# Patient Record
Sex: Female | Born: 1965 | Race: White | Hispanic: No | Marital: Married | Smoking: Never smoker
Health system: Southern US, Community
[De-identification: ages and names within clinical notes are randomized; demographics above are authoritative.]

## PROBLEM LIST (undated history)

## (undated) DIAGNOSIS — K219 Gastro-esophageal reflux disease without esophagitis: Secondary | ICD-10-CM

## (undated) DIAGNOSIS — Z9889 Other specified postprocedural states: Secondary | ICD-10-CM

## (undated) DIAGNOSIS — R112 Nausea with vomiting, unspecified: Secondary | ICD-10-CM

## (undated) DIAGNOSIS — Z87442 Personal history of urinary calculi: Secondary | ICD-10-CM

## (undated) DIAGNOSIS — T8859XA Other complications of anesthesia, initial encounter: Secondary | ICD-10-CM

## (undated) DIAGNOSIS — J189 Pneumonia, unspecified organism: Secondary | ICD-10-CM

## (undated) DIAGNOSIS — N200 Calculus of kidney: Secondary | ICD-10-CM

## (undated) DIAGNOSIS — A419 Sepsis, unspecified organism: Secondary | ICD-10-CM

## (undated) HISTORY — PX: DILATION AND CURETTAGE OF UTERUS: SHX78

## (undated) HISTORY — DX: Gastro-esophageal reflux disease without esophagitis: K21.9

---

## 1995-05-04 HISTORY — PX: URETEROSCOPY: SHX842

## 2006-05-03 HISTORY — PX: LITHOTRIPSY: SUR834

## 2010-07-20 ENCOUNTER — Ambulatory Visit
Admission: RE | Admit: 2010-07-20 | Discharge: 2010-07-20 | Disposition: A | Discharge status: Home / Self Care | Payer: BC Managed Care – PPO | Source: Ambulatory Visit | Attending: Family Medicine | Admitting: Family Medicine

## 2010-07-20 ENCOUNTER — Other Ambulatory Visit: Payer: Self-pay | Admitting: Family Medicine

## 2010-07-20 DIAGNOSIS — J3489 Other specified disorders of nose and nasal sinuses: Secondary | ICD-10-CM

## 2015-12-08 ENCOUNTER — Observation Stay (HOSPITAL_COMMUNITY)
Admission: EM | Admit: 2015-12-08 | Discharge: 2015-12-10 | Disposition: A | Discharge status: Home / Self Care | Payer: BLUE CROSS/BLUE SHIELD | Attending: Urology | Admitting: Urology

## 2015-12-08 DIAGNOSIS — R109 Unspecified abdominal pain: Secondary | ICD-10-CM

## 2015-12-08 DIAGNOSIS — N132 Hydronephrosis with renal and ureteral calculous obstruction: Principal | ICD-10-CM | POA: Insufficient documentation

## 2015-12-08 DIAGNOSIS — N200 Calculus of kidney: Secondary | ICD-10-CM

## 2015-12-08 DIAGNOSIS — N201 Calculus of ureter: Secondary | ICD-10-CM | POA: Diagnosis present

## 2015-12-08 HISTORY — DX: Calculus of kidney: N20.0

## 2015-12-09 ENCOUNTER — Encounter (HOSPITAL_COMMUNITY): Payer: Self-pay | Admitting: Emergency Medicine

## 2015-12-09 ENCOUNTER — Observation Stay (HOSPITAL_COMMUNITY): Payer: BLUE CROSS/BLUE SHIELD | Admitting: Anesthesiology

## 2015-12-09 ENCOUNTER — Emergency Department (HOSPITAL_COMMUNITY): Payer: BLUE CROSS/BLUE SHIELD

## 2015-12-09 ENCOUNTER — Encounter (HOSPITAL_COMMUNITY)
Admission: EM | Disposition: A | Discharge status: Home / Self Care | Payer: Self-pay | Source: Home / Self Care | Attending: Emergency Medicine

## 2015-12-09 ENCOUNTER — Ambulatory Visit: Admit: 2015-12-09 | Payer: Self-pay | Admitting: Urology

## 2015-12-09 DIAGNOSIS — N201 Calculus of ureter: Secondary | ICD-10-CM | POA: Diagnosis present

## 2015-12-09 DIAGNOSIS — N200 Calculus of kidney: Secondary | ICD-10-CM

## 2015-12-09 DIAGNOSIS — N132 Hydronephrosis with renal and ureteral calculous obstruction: Secondary | ICD-10-CM | POA: Diagnosis not present

## 2015-12-09 HISTORY — PX: CYSTOSCOPY WITH RETROGRADE PYELOGRAM, URETEROSCOPY AND STENT PLACEMENT: SHX5789

## 2015-12-09 LAB — CBC WITH DIFFERENTIAL/PLATELET
Basophils Absolute: 0 10*3/uL (ref 0.0–0.1)
Basophils Relative: 0 %
EOS ABS: 0.2 10*3/uL (ref 0.0–0.7)
EOS PCT: 2 %
HCT: 33.8 % — ABNORMAL LOW (ref 36.0–46.0)
Hemoglobin: 11.5 g/dL — ABNORMAL LOW (ref 12.0–15.0)
LYMPHS ABS: 1.7 10*3/uL (ref 0.7–4.0)
LYMPHS PCT: 16 %
MCH: 30.3 pg (ref 26.0–34.0)
MCHC: 34 g/dL (ref 30.0–36.0)
MCV: 89.2 fL (ref 78.0–100.0)
MONOS PCT: 11 %
Monocytes Absolute: 1.1 10*3/uL — ABNORMAL HIGH (ref 0.1–1.0)
Neutro Abs: 7.2 10*3/uL (ref 1.7–7.7)
Neutrophils Relative %: 71 %
PLATELETS: 230 10*3/uL (ref 150–400)
RBC: 3.79 MIL/uL — AB (ref 3.87–5.11)
RDW: 12.5 % (ref 11.5–15.5)
WBC: 10.1 10*3/uL (ref 4.0–10.5)

## 2015-12-09 LAB — I-STAT CHEM 8, ED
BUN: 16 mg/dL (ref 6–20)
CALCIUM ION: 1.13 mmol/L (ref 1.13–1.30)
CHLORIDE: 102 mmol/L (ref 101–111)
Creatinine, Ser: 1.2 mg/dL — ABNORMAL HIGH (ref 0.44–1.00)
GLUCOSE: 97 mg/dL (ref 65–99)
HCT: 35 % — ABNORMAL LOW (ref 36.0–46.0)
Hemoglobin: 11.9 g/dL — ABNORMAL LOW (ref 12.0–15.0)
Potassium: 3.8 mmol/L (ref 3.5–5.1)
Sodium: 140 mmol/L (ref 135–145)
TCO2: 26 mmol/L (ref 0–100)

## 2015-12-09 LAB — SURGICAL PCR SCREEN
MRSA, PCR: NEGATIVE
STAPHYLOCOCCUS AUREUS: NEGATIVE

## 2015-12-09 LAB — URINALYSIS, ROUTINE W REFLEX MICROSCOPIC
BILIRUBIN URINE: NEGATIVE
Glucose, UA: NEGATIVE mg/dL
HGB URINE DIPSTICK: NEGATIVE
KETONES UR: NEGATIVE mg/dL
Leukocytes, UA: NEGATIVE
Nitrite: NEGATIVE
PH: 6.5 (ref 5.0–8.0)
Protein, ur: NEGATIVE mg/dL
SPECIFIC GRAVITY, URINE: 1.018 (ref 1.005–1.030)

## 2015-12-09 SURGERY — CYSTOURETEROSCOPY, WITH RETROGRADE PYELOGRAM AND STENT INSERTION
Anesthesia: General | Site: Ureter | Laterality: Left

## 2015-12-09 MED ORDER — FENTANYL CITRATE (PF) 100 MCG/2ML IJ SOLN
50.0000 ug | Freq: Once | INTRAMUSCULAR | Status: AC
  Administered 2015-12-09: 50 ug via INTRAVENOUS
  Filled 2015-12-09: qty 2

## 2015-12-09 MED ORDER — CEFAZOLIN IN D5W 1 GM/50ML IV SOLN
1.0000 g | Freq: Three times a day (TID) | INTRAVENOUS | Status: DC
  Administered 2015-12-10 (×2): 1 g via INTRAVENOUS
  Filled 2015-12-09 (×5): qty 50

## 2015-12-09 MED ORDER — ONDANSETRON HCL 4 MG/2ML IJ SOLN
4.0000 mg | Freq: Once | INTRAMUSCULAR | Status: AC
  Administered 2015-12-09: 4 mg via INTRAVENOUS
  Filled 2015-12-09: qty 2

## 2015-12-09 MED ORDER — DIPHENHYDRAMINE HCL 50 MG/ML IJ SOLN: 12.5000 mg | Freq: Four times a day (QID) | INTRAMUSCULAR | Status: DC | PRN

## 2015-12-09 MED ORDER — SODIUM CHLORIDE 0.9 % IR SOLN
Status: DC | PRN
  Administered 2015-12-09: 3000 mL via INTRAVESICAL
  Administered 2015-12-09: 1000 mL via INTRAVESICAL

## 2015-12-09 MED ORDER — TRAMADOL HCL 50 MG PO TABS: 50.0000 mg | ORAL_TABLET | Freq: Four times a day (QID) | ORAL | 0 refills | Status: DC | PRN

## 2015-12-09 MED ORDER — CEFAZOLIN SODIUM-DEXTROSE 2-4 GM/100ML-% IV SOLN
INTRAVENOUS | Status: AC
  Filled 2015-12-09: qty 100

## 2015-12-09 MED ORDER — KCL IN DEXTROSE-NACL 20-5-0.45 MEQ/L-%-% IV SOLN
INTRAVENOUS | Status: DC
  Administered 2015-12-10: 01:00:00 via INTRAVENOUS
  Filled 2015-12-09: qty 1000

## 2015-12-09 MED ORDER — DEXAMETHASONE SODIUM PHOSPHATE 10 MG/ML IJ SOLN
INTRAMUSCULAR | Status: AC
  Filled 2015-12-09: qty 1

## 2015-12-09 MED ORDER — LIDOCAINE HCL (CARDIAC) 20 MG/ML IV SOLN
INTRAVENOUS | Status: DC | PRN
  Administered 2015-12-09: 100 mg via INTRAVENOUS

## 2015-12-09 MED ORDER — HYDROMORPHONE HCL 1 MG/ML IJ SOLN
1.0000 mg | INTRAMUSCULAR | Status: DC | PRN
  Administered 2015-12-09 – 2015-12-10 (×2): 1 mg via INTRAVENOUS
  Administered 2015-12-10 (×2): 0.5 mg via INTRAVENOUS
  Filled 2015-12-09 (×4): qty 1

## 2015-12-09 MED ORDER — LACTATED RINGERS IV SOLN
INTRAVENOUS | Status: DC | PRN
  Administered 2015-12-09: 17:00:00 via INTRAVENOUS

## 2015-12-09 MED ORDER — KETOROLAC TROMETHAMINE 10 MG PO TABS: 10.0000 mg | ORAL_TABLET | Freq: Four times a day (QID) | ORAL | 0 refills | Status: DC | PRN

## 2015-12-09 MED ORDER — ONDANSETRON HCL 4 MG/2ML IJ SOLN
INTRAMUSCULAR | Status: DC | PRN
  Administered 2015-12-09: 4 mg via INTRAVENOUS

## 2015-12-09 MED ORDER — ONDANSETRON 4 MG PO TBDP
4.0000 mg | ORAL_TABLET | Freq: Once | ORAL | Status: AC
  Administered 2015-12-09: 4 mg via ORAL
  Filled 2015-12-09: qty 1

## 2015-12-09 MED ORDER — FENTANYL CITRATE (PF) 100 MCG/2ML IJ SOLN
INTRAMUSCULAR | Status: DC | PRN
  Administered 2015-12-09: 50 ug via INTRAVENOUS

## 2015-12-09 MED ORDER — KETOROLAC TROMETHAMINE 30 MG/ML IJ SOLN
30.0000 mg | Freq: Once | INTRAMUSCULAR | Status: AC
  Administered 2015-12-09: 30 mg via INTRAVENOUS
  Filled 2015-12-09: qty 1

## 2015-12-09 MED ORDER — LIDOCAINE HCL (CARDIAC) 20 MG/ML IV SOLN
INTRAVENOUS | Status: AC
  Filled 2015-12-09: qty 5

## 2015-12-09 MED ORDER — CEFAZOLIN SODIUM-DEXTROSE 2-4 GM/100ML-% IV SOLN
2.0000 g | INTRAVENOUS | Status: AC
  Administered 2015-12-09: 2 g via INTRAVENOUS
  Filled 2015-12-09: qty 100

## 2015-12-09 MED ORDER — DIPHENHYDRAMINE HCL 50 MG/ML IJ SOLN
25.0000 mg | Freq: Once | INTRAMUSCULAR | Status: AC
  Administered 2015-12-09: 25 mg via INTRAVENOUS
  Filled 2015-12-09: qty 1

## 2015-12-09 MED ORDER — HYDROMORPHONE HCL 1 MG/ML IJ SOLN
1.0000 mg | Freq: Once | INTRAMUSCULAR | Status: AC
  Administered 2015-12-09: 1 mg via INTRAVENOUS
  Filled 2015-12-09: qty 1

## 2015-12-09 MED ORDER — PROPOFOL 10 MG/ML IV BOLUS
INTRAVENOUS | Status: DC | PRN
  Administered 2015-12-09: 180 mg via INTRAVENOUS

## 2015-12-09 MED ORDER — MIDAZOLAM HCL 5 MG/5ML IJ SOLN
INTRAMUSCULAR | Status: DC | PRN
  Administered 2015-12-09: 1 mg via INTRAVENOUS

## 2015-12-09 MED ORDER — SODIUM CHLORIDE 0.9 % IJ SOLN
INTRAMUSCULAR | Status: AC
  Filled 2015-12-09: qty 10

## 2015-12-09 MED ORDER — ONDANSETRON HCL 4 MG/2ML IJ SOLN: 4.0000 mg | Freq: Three times a day (TID) | INTRAMUSCULAR | Status: AC | PRN

## 2015-12-09 MED ORDER — ONDANSETRON HCL 4 MG/2ML IJ SOLN
INTRAMUSCULAR | Status: AC
  Filled 2015-12-09: qty 2

## 2015-12-09 MED ORDER — CEPHALEXIN 500 MG PO CAPS: 500.0000 mg | ORAL_CAPSULE | Freq: Two times a day (BID) | ORAL | 0 refills | Status: DC

## 2015-12-09 MED ORDER — EPHEDRINE SULFATE 50 MG/ML IJ SOLN
INTRAMUSCULAR | Status: DC | PRN
  Administered 2015-12-09: 5 mg via INTRAVENOUS

## 2015-12-09 MED ORDER — MIDAZOLAM HCL 2 MG/2ML IJ SOLN
INTRAMUSCULAR | Status: AC
  Filled 2015-12-09: qty 2

## 2015-12-09 MED ORDER — SODIUM CHLORIDE 0.9 % IV SOLN
INTRAVENOUS | Status: AC
  Administered 2015-12-09: 06:00:00 via INTRAVENOUS

## 2015-12-09 MED ORDER — FENTANYL CITRATE (PF) 250 MCG/5ML IJ SOLN
INTRAMUSCULAR | Status: AC
  Filled 2015-12-09: qty 5

## 2015-12-09 MED ORDER — SODIUM CHLORIDE 0.9 % IV SOLN
Freq: Once | INTRAVENOUS | Status: AC
  Administered 2015-12-09: 02:00:00 via INTRAVENOUS

## 2015-12-09 MED ORDER — IOPAMIDOL (ISOVUE-300) INJECTION 61%
INTRAVENOUS | Status: DC | PRN
  Administered 2015-12-09: 8 mL via URETHRAL

## 2015-12-09 MED ORDER — EPHEDRINE SULFATE 50 MG/ML IJ SOLN
INTRAMUSCULAR | Status: AC
  Filled 2015-12-09: qty 1

## 2015-12-09 MED ORDER — PROPOFOL 10 MG/ML IV BOLUS
INTRAVENOUS | Status: AC
  Filled 2015-12-09: qty 20

## 2015-12-09 MED ORDER — PROMETHAZINE HCL 25 MG/ML IJ SOLN
12.5000 mg | Freq: Four times a day (QID) | INTRAMUSCULAR | Status: DC | PRN
  Administered 2015-12-09: 12.5 mg via INTRAVENOUS
  Filled 2015-12-09: qty 1

## 2015-12-09 MED ORDER — FENTANYL CITRATE (PF) 100 MCG/2ML IJ SOLN
100.0000 ug | Freq: Once | INTRAMUSCULAR | Status: AC
  Administered 2015-12-09: 100 ug via INTRAVENOUS
  Filled 2015-12-09: qty 2

## 2015-12-09 MED ORDER — HYDROMORPHONE HCL 1 MG/ML IJ SOLN
1.0000 mg | INTRAMUSCULAR | Status: DC | PRN
  Administered 2015-12-09: 1 mg via INTRAVENOUS
  Filled 2015-12-09: qty 1

## 2015-12-09 MED ORDER — DEXAMETHASONE SODIUM PHOSPHATE 10 MG/ML IJ SOLN
INTRAMUSCULAR | Status: DC | PRN
  Administered 2015-12-09: 10 mg via INTRAVENOUS

## 2015-12-09 SURGICAL SUPPLY — 21 items
BAG URO CATCHER STRL LF (MISCELLANEOUS) ×3 IMPLANT
BASKET LASER NITINOL 1.9FR (BASKET) ×3 IMPLANT
CATH INTERMIT  6FR 70CM (CATHETERS) ×3 IMPLANT
CLOTH BEACON ORANGE TIMEOUT ST (SAFETY) ×3 IMPLANT
FIBER LASER FLEXIVA 1000 (UROLOGICAL SUPPLIES) IMPLANT
FIBER LASER FLEXIVA 365 (UROLOGICAL SUPPLIES) IMPLANT
FIBER LASER FLEXIVA 550 (UROLOGICAL SUPPLIES) IMPLANT
FIBER LASER TRAC TIP (UROLOGICAL SUPPLIES) ×3 IMPLANT
GLOVE BIOGEL M STRL SZ7.5 (GLOVE) ×3 IMPLANT
GOWN STRL REUS W/TWL LRG LVL3 (GOWN DISPOSABLE) ×6 IMPLANT
GUIDEWIRE ANG ZIPWIRE 038X150 (WIRE) ×6 IMPLANT
GUIDEWIRE STR DUAL SENSOR (WIRE) ×3 IMPLANT
IV NS 1000ML (IV SOLUTION) ×2
IV NS 1000ML BAXH (IV SOLUTION) ×1 IMPLANT
MANIFOLD NEPTUNE II (INSTRUMENTS) ×3 IMPLANT
PACK CYSTO (CUSTOM PROCEDURE TRAY) ×3 IMPLANT
STENT POLARIS 5FRX24 (STENTS) ×3 IMPLANT
SYR CONTROL 10ML LL (SYRINGE) ×3 IMPLANT
TUBE FEEDING 8FR 16IN STR KANG (MISCELLANEOUS) ×3 IMPLANT
TUBING CONNECTING 10 (TUBING) ×2 IMPLANT
TUBING CONNECTING 10' (TUBING) ×1

## 2015-12-09 NOTE — Transfer of Care (Signed)
Immediate Anesthesia Transfer of Care Note  Patient: Megan Dalton  Procedure(s) Performed: Procedure(s): CYSTOSCOPY WITH LEFT RETROGRADE PYELOGRAM, LEFT URETEROSCOPY STONE MANIPULATION WITH STONE OBTAINED AND LEFT STENT PLACEMENT (Left)  Patient Location: PACU  Anesthesia Type:General  Level of Consciousness: awake, alert , oriented and patient cooperative  Airway & Oxygen Therapy: Patient Spontanous Breathing and Patient connected to face mask oxygen  Post-op Assessment: Report given to RN, Post -op Vital signs reviewed and stable and Patient moving all extremities  Post vital signs: Reviewed and stable  Last Vitals:  Vitals:   12/09/15 0331 12/09/15 0535  BP: 107/67 106/65  Pulse: 70 67  Resp: 19 19  Temp: 36.8 C 37 C    Last Pain:  Vitals:   12/09/15 1432  TempSrc:   PainSc: Asleep      Patients Stated Pain Goal: 2 ([id number])  Complications: No apparent anesthesia complications

## 2015-12-09 NOTE — Anesthesia Postprocedure Evaluation (Signed)
Anesthesia Post Note  Patient: Briella Warne  Procedure(s) Performed: Procedure(s) (LRB): CYSTOSCOPY WITH LEFT RETROGRADE PYELOGRAM, LEFT URETEROSCOPY STONE MANIPULATION WITH STONE OBTAINED AND LEFT STENT PLACEMENT (Left)  Patient location during evaluation: PACU Anesthesia Type: General Level of consciousness: awake and alert Pain management: pain level controlled Vital Signs Assessment: post-procedure vital signs reviewed and stable Respiratory status: spontaneous breathing, nonlabored ventilation, respiratory function stable and patient connected to nasal cannula oxygen Cardiovascular status: blood pressure returned to baseline and stable Postop Assessment: no signs of nausea or vomiting Anesthetic complications: no    Last Vitals:  Vitals:   12/09/15 1930 12/09/15 1940  BP: 122/66 115/61  Pulse: 98 89  Resp: (!) 21 20  Temp:      Last Pain:  Vitals:   12/09/15 1830  TempSrc:   PainSc: Tyler Deis

## 2015-12-09 NOTE — ED Provider Notes (Signed)
[facility] DEPT Provider Note   CSN: [id number] Arrival date & time: 12/08/15  2343  By signing my name below, I, Gwenlyn Fudge, attest that this documentation has been prepared under the direction and in the presence of Junius Creamer, NP. Electronically Signed: Gwenlyn Fudge, ED Scribe. 12/09/15. 1:01 AM.  First MD Initiated Contact with Patient 12/09/15 0054    History   Chief Complaint No chief complaint on file.   The history is provided by the patient and the spouse. No language interpreter was used.    HPI Comments: Megan Dalton is a 50 y.o. female with PMHx of Kidney Stones who presents to the Emergency Department complaining of gradual onset and worsening, constant left flank pain. Pt reports associated nausea and vomiting. Pt was seen in the ER in [place] where she was diagnosed with a 6 mm stone located below the Uretero Pelvic junction. Pt states her last stone like this was 10 years ago that required surgery, but has had smaller stones since that she was able to pass on her own. Pt states she has had stones since she was 12. Husband states she managed the pain well today, but has gotten worse as the day has gone on. Pt states she feels as if the stone has shifted a little.  Past Medical History:  Diagnosis Date  . Kidney stones     There are no active problems to display for this patient.   History reviewed. No pertinent surgical history.  OB History    No data available       Home Medications    Prior to Admission medications   Medication Sig Start Date End Date Taking? Authorizing Provider  diazepam (VALIUM) 5 MG tablet Take 2.5 mg by mouth every 6 (six) hours as needed for anxiety.   Yes Historical Provider, MD  diclofenac (VOLTAREN) 50 MG EC tablet Take 50 mg by mouth 3 (three) times daily.   Yes Historical Provider, MD  tamsulosin (FLOMAX) 0.4 MG CAPS capsule Take 0.4 mg by mouth daily.   Yes Historical Provider, MD    Family History No family history on  file.  Social History Social History  Substance Use Topics  . Smoking status: Never Smoker  . Smokeless tobacco: Never Used  . Alcohol use No     Allergies   Hydrocodone and Morphine and related   Review of Systems Review of Systems  Gastrointestinal: Positive for nausea and vomiting.  Genitourinary: Positive for flank pain. Negative for hematuria.  All other systems reviewed and are negative.    Physical Exam Updated Vital Signs BP 128/71 (BP Location: Left Arm)   Pulse 80   Temp 98.4 F (36.9 C) (Oral)   Resp 22   Ht 5\' 2"  (1.575 m)   Wt 135 lb (61.2 kg)   LMP 12/03/2015 (Approximate)   SpO2 98%   BMI 24.69 kg/m   Physical Exam  Constitutional: She appears well-developed and well-nourished. She appears distressed.  HENT:  Head: Normocephalic.  Eyes: Pupils are equal, round, and reactive to light.  Neck: Normal range of motion.  Cardiovascular: Normal rate.   Pulmonary/Chest: Effort normal.  Abdominal: Soft. Bowel sounds are normal.  Musculoskeletal: Normal range of motion.  Neurological: She is alert.  Skin: Skin is warm and dry.  Nursing note and vitals reviewed.    ED Treatments / Results  DIAGNOSTIC STUDIES: Oxygen Saturation is 98% on RA, normal by my interpretation.    COORDINATION OF CARE: 12:59 AM Discussed treatment plan  with pt at bedside which includes iv fluids pain control and antiemeticand pt agreed to plan.  Labs (all labs ordered are listed, but only abnormal results are displayed) Labs Reviewed - No data to display  EKG  EKG Interpretation None       Radiology No results found.  Procedures Procedures (including critical care time)  Medications Ordered in ED Medications  ondansetron (ZOFRAN-ODT) disintegrating tablet 4 mg (4 mg Oral Given 12/09/15 0027)     Initial Impression / Assessment and Plan / ED Course  I have reviewed the triage vital signs and the nursing notes.  Pertinent labs & imaging results that were  available during my care of the patient were reviewed by me and considered in my medical decision making (see chart for details).  Clinical Course       Final Clinical Impressions(s) / ED Diagnoses   Final diagnoses:  None    New Prescriptions New Prescriptions   No medications on file   I personally performed the services described in this documentation, which was scribed in my presence. The recorded information has been reviewed and is accurate.    Junius Creamer, NP 12/09/15 0124    Junius Creamer, NP [id number]    Delora Fuel, MD [id number]

## 2015-12-09 NOTE — ED Notes (Signed)
Pt in ct 

## 2015-12-09 NOTE — Anesthesia Procedure Notes (Signed)
Procedure Name: LMA Insertion Date/Time: 12/09/2015 5:46 PM Performed by: Carleene Cooper A Pre-anesthesia Checklist: Patient identified, Emergency Drugs available, Suction available, Patient being monitored and Timeout performed Patient Re-evaluated:Patient Re-evaluated prior to inductionOxygen Delivery Method: Circle system utilized Preoxygenation: Pre-oxygenation with 100% oxygen Intubation Type: IV induction LMA: LMA with gastric port inserted LMA Size: 4.0 Number of attempts: 1 Placement Confirmation: positive ETCO2 and breath sounds checked- equal and bilateral Tube secured with: Tape Dental Injury: Teeth and Oropharynx as per pre-operative assessment

## 2015-12-09 NOTE — Progress Notes (Signed)
Dr Manny at bedside  

## 2015-12-09 NOTE — ED Triage Notes (Signed)
Patient is complaining left flank. Patient was in an ER in [place] and they said she has 6 millimeters. Patient states she was able to manage pain but tonight she thinks it is moving and can not bare the pain. Patient is feeling nauseated and dry heaving.

## 2015-12-09 NOTE — Progress Notes (Signed)
PHARMACY NOTE -  ANTIBIOTIC RENAL DOSE ADJUSTMENT    Request received for Pharmacy to assist with antibiotic renal dose adjustment.   Patient has been initiated on Ancef for postop fevers s/p ureteroscopy w/ stent placment.  SCr 1.2, estimated CrCl 49 ml/min  Current dosage is appropriate and need for further dosage adjustment appears unlikely at present.  Will sign off at this time.  Please reconsult if a change in clinical status warrants re-evaluation of dosage.  Reuel Boom, PharmD, BCPS Pager: [phone] 12/09/2015, 7:58 PM

## 2015-12-09 NOTE — Discharge Instructions (Signed)
1 - You may have urinary urgency (bladder spasms) and bloody urine on / off with stent in place. This is normal.  2 - Removed tethered stent on Friday morning at home by pulling on string, then blue-white plastic tubing, and discarding. Office is open Friday if any issues arise.   3 - Call MD or go to ER for fever >102, severe pain / nausea / vomiting not relieved by medications, or acute change in medical status

## 2015-12-09 NOTE — Progress Notes (Signed)
Pt with low grade fevers in PACU. Tm 100.5. Will observe over night, obtain UCX, and continue IV ancef. Needs to be afebrile before discharge. Pt and family updated.

## 2015-12-09 NOTE — H&P (Signed)
Subjective: CC: Left flank pain  Hx: Megan Dalton is a 50 yo WF who was admitted through the ER today for an 96m left ureteral stone at the inferior border of the sacroiliac joint.   She [place] of [place]  This was her 3rd ER visit and her pain is not completely eliminated with medication.  The pain was severe and associated with nausea and vomiting.   She had no fever or voiding complaints and reported no gross hematuria.   She has had stones since age [age]and they have been analyzed as calcium oxalate.  She had ureteroscopy about 20 years ago and was told her ureter had a J curve.  [facility]    ROS:  Review of Systems  Gastrointestinal: Positive for nausea and vomiting.  Genitourinary: Positive for flank pain.  All other systems reviewed and are negative.   Allergies  Allergen Reactions  . Hydrocodone Nausea And Vomiting  . Morphine And Related Nausea And Vomiting    Can take with anti nausea medication    Past Medical History:  Diagnosis Date  . Kidney stones     Past Surgical History:  Procedure Laterality Date  . LITHOTRIPSY  2008   approx  . URETEROSCOPY  1997    Social History   Social History  . Marital status: Married    Spouse name: N/A  . Number of children: N/A  . Years of education: N/A   Occupational History  . Not on file.   Social History Main Topics  . Smoking status: Never Smoker  . Smokeless tobacco: Never Used  . Alcohol use No  . Drug use: No  . Sexual activity: Not on file   Other Topics Concern  . Not on file   Social History Narrative  . No narrative on file    No family history on file.  Anti-infectives: Anti-infectives    Start     Dose/Rate Route Frequency Ordered Stop   12/09/15 0822  ceFAZolin (ANCEF) IVPB 2g/100 mL premix     2 g 200 mL/hr over 30 Minutes Intravenous 30 min pre-op 12/09/15 00017       Current Facility-Administered Medications   Medication Dose Route Frequency Provider Last Rate Last Dose  . 0.9 %  sodium chloride infusion   Intravenous STAT GJunius Creamer NP 125 mL/hr at 12/09/15 0554    . ceFAZolin (ANCEF) IVPB 2g/100 mL premix  2 g Intravenous 30 min Pre-Op JIrine Seal MD      . HYDROmorphone (DILAUDID) injection 1 mg  1 mg Intravenous Q4H PRN GJunius Creamer NP      . ondansetron ([facility] injection 4 mg  4 mg Intravenous Q8H PRN GJunius Creamer NP         Objective: Vital signs in last 24 hours: Temp:  [98.3 F (36.8 C)-98.6 F (37 C)] 98.6 F (37 C) (08/08 0535) Pulse Rate:  [67-80] 67 (08/08 0535) Resp:  [19-22] 19 (08/08 0535) BP: (106-128)/(65-71) 106/65 (08/08 0535) SpO2:  [93 %-99 %] 97 % (08/08 0535) Weight:  [61.2 kg (135 lb)] 61.2 kg (135 lb) (08/07 2352)  Intake/Output from previous day: 08/07 0701 - 08/08 0700 In: 0  Out: 1 [Urine:1] Intake/Output this shift: No intake/output data recorded.   Physical Exam  Constitutional: She is oriented to person, place, and time and well-developed, well-nourished, and in no distress.  HENT:  Head: Normocephalic and  atraumatic.  Neck: Normal range of motion. Neck supple. No thyromegaly present.  Cardiovascular: Normal rate, regular rhythm and normal heart sounds.   Pulmonary/Chest: Effort normal and breath sounds normal. No respiratory distress.  Abdominal: Soft. Bowel sounds are normal. She exhibits no distension and no mass. There is tenderness (left flank and LLQ).  Musculoskeletal: Normal range of motion. She exhibits no edema or tenderness.  Lymphadenopathy:    She has no cervical adenopathy.  Neurological: She is alert and oriented to person, place, and time.  But mildly medicated  Skin: Skin is warm and dry.  Psychiatric: Mood and affect normal.  Vitals reviewed.   Lab Results:   Recent Labs  12/09/15 0154 12/09/15 0201  WBC 10.1  --   HGB 11.5* 11.9*  HCT 33.8* 35.0*  PLT 230  --    BMET  Recent Labs  12/09/15 0201  NA 140  K  3.8  CL 102  GLUCOSE 97  BUN 16  CREATININE 1.20*   PT/INR No results for input(s): LABPROT, INR in the last 72 hours. ABG No results for input(s): PHART, HCO3 in the last 72 hours.  Invalid input(s): PCO2, PO2  Studies/Results: Ct Renal Stone Study  Result Date: 12/09/2015 CLINICAL DATA:  Acute onset of worsening left flank pain, nausea and vomiting. Initial encounter. EXAM: CT ABDOMEN AND PELVIS WITHOUT CONTRAST TECHNIQUE: Multidetector CT imaging of the abdomen and pelvis was performed following the standard protocol without IV contrast. COMPARISON:  None. FINDINGS: Trace bilateral pleural effusions are noted, with associated atelectasis. The liver and spleen are unremarkable in appearance. The gallbladder is within normal limits. The pancreas and adrenal glands are unremarkable. Mild left-sided hydronephrosis is noted, with prominence of the left ureter along much of its course, to the level of an obstructing 8 x 6 mm stone at the distal left ureter, 7 cm above the left vesicoureteral junction. Nonobstructing right renal stones measure up to 3 mm in size. No significant perinephric stranding is seen. No free fluid is identified. The small bowel is unremarkable in appearance. The stomach is within normal limits. No acute vascular abnormalities are seen. The appendix is normal in caliber, without evidence of appendicitis. The colon is grossly unremarkable in appearance. The bladder is mildly distended and grossly unremarkable. The uterus is unremarkable in appearance. The ovaries are relatively symmetric. No suspicious adnexal masses are seen. No inguinal lymphadenopathy is seen. No acute osseous abnormalities are identified. 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 IMPRESSION: 1. Mild left-sided hydronephrosis, with an obstructing large 8 x 6 mm stone at the distal left ureter, 7 cm above the left vesicoureteral junction. Given its size, this is less likely to pass  on its own. 2. Nonobstructing right renal stones measure up to 3 mm in size. 3. 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 Electronically Signed   By: Garald Balding M.D.   On: 12/09/2015 02:41   Labs, CT films and report and ER notes reviewed.  I discussed her case with the ER physician.   Assessment: 1. 66m left mid/distal ureteral stone with persistent pain. 2. Mild renal insufficiency. 3. Recurrent Calcium oxalate urolithiasis.   Plan: I reviewed the treatment options including MET, ESWL and ureteroscopy but with her active symptoms and the location of the stone I feel ureteroscopy is the most appropriate option.   I have reviewed the risks of the procedure including bleeding, infection, ureteral injury, need for secondary procedures, need for a  stent, thrombotic events and anesthetic complications.   We will proceed this afternoon.        Paydon Carll J 12/09/2015 [phone]

## 2015-12-09 NOTE — Op Note (Signed)
NAMERENNETTE, TOURE                   ACCOUNT NO.:  [id number]  MEDICAL RECORD NO.:  [id number]  LOCATION:  [id number]                         FACILITY[facility]  [facility]  PHYSICIAN:  Alexis Frock, MD     DATE OF BIRTH:  12-28-1965  DATE OF PROCEDURE: 12/09/2015                              OPERATIVE REPORT  DIAGNOSIS:  Left ureteral stone, refractory colic.  PROCEDURES: 1. Cystoscopy with left retrograde pyelogram and interpretation. 2. Left ureteroscopy and laser lithotripsy. 3. Insertion of left ureteral stent, 5 x 24 Polaris with tether.  SURGEON:  Alexis Frock, MD.  ESTIMATED BLOOD LOSS:  Nil.  COMPLICATIONS:  None.  SPECIMENS:  Left ureteral stone fragments for compositional analysis.  FINDINGS: 1. Unremarkable urinary bladder. 2. Mild left hydronephrosis to a mobile filling defect in the mid     ureter consistent with a known stone. 3. Complete resolution of all stone fragments larger than 1/3rd     millimeters in the left ureter following laser lithotripsy and     basket extraction. 4. Successful placement of left ureteral stent, proximal in renal     pelvis and distal in urinary bladder.  INDICATIONS:  Ms. Mcfarland is a pleasant 50 year old lady who was found on workup of colicky flank pain, to have a left ureteral stent.  She underwent trial medical therapy but has been refractory to this.  She has had three ER visits recently related to the stone.  She was evaluated this morning by one of my colleagues, Dr. Jeffie Pollock who offered the patient definitive stone management.  Her symptoms were refractory, and she wished to proceed with left ureteroscopic stone manipulation. Her urine revealed no infectious parameters.  She wished to proceed. Informed consent was obtained and placed in the medical record.  PROCEDURE IN DETAIL:  The patient being Arti Kose and the procedure being left ureteroscopic stone manipulation was confirmed.  Procedure was carried out.  Time-out was performed.   Intravenous antibiotics were administered.  General endotracheal anesthesia was introduced.  The patient was placed into a low lithotomy position.  Sterile field was created by prepping and draping the patient's vagina, introitus, and proximal thighs using iodine x3.  Next, cystourethroscopy was performed using a 21-French rigid cystoscope with offset lens.  Inspection of bladder revealed no diverticula, calcifications, papillary lesions. Ureteral orifices appeared singleton.  The left ureteral orifice was cannulated with a 6-French end-hole catheter, and left retrograde pyelogram was obtained.  Left retrograde pyelogram demonstrated a single left ureter with single- system left kidney.  There was a mobile filling defect in the mid ureter consistent with a known stone.  A 0.038 Zip wire was advanced into the lower pole, set aside as a safety wire.  An 8-French feeding tube was placed in the urinary bladder for pressure release.  Next, semi-rigid ureteroscopy was performed of the distal left ureter alongside a separate Sensor working wire.  The stone in question was indeed encountered just above the level of the iliac vessels.  At this point, it appeared to be much too large for simple basketing.  As such, holmium laser energy applied to the stone using settings of 0.2 joules and 20 Hz fragmenting  the stone approximately into four smaller pieces, that were then sequentially grasped on the long axis, removed and set aside for compositional analysis.  Repeat ureteroscopy to the level of proximal fourth of the ureter revealed complete resolution of all stone fragments larger than 1/3rd millimeters.  There was no evidence of ureteral dilation.  Additional contrast retrograde pyelography revealed no residual filling defects.  There was some mucosal edema in the distal third of the ureter, likely corresponding to the area of prior stone impaction.  It was felt that brief interval stenting would  be warranted with a tethered stent.  As such, a new 5 x 24 Polaris-type stent was placed in a safety wire using fluoroscopic guidance.  Good proximal and distal deployment were noted.  Tether was left in place, fashioned to the thigh, and the procedure was then terminated.  The patient tolerated the procedure well.  No immediate periprocedural complications were noted.  The patient was taken to the postanesthesia care unit in stable condition.          ______________________________ Alexis Frock, MD     TM/MEDQ  D:  12/09/2015  T:  12/09/2015  Job:  [id number]

## 2015-12-09 NOTE — Brief Op Note (Signed)
12/08/2015 - 12/09/2015  6:20 PM  PATIENT:  Megan Dalton  50 y.o. female  PRE-OPERATIVE DIAGNOSIS:  left distal ureteral stone  POST-OPERATIVE DIAGNOSIS:  left distal ureteral stone  PROCEDURE:  Procedure(s): CYSTOSCOPY WITH LEFT RETROGRADE PYELOGRAM, LEFT URETEROSCOPY STONE MANIPULATION WITH STONE OBTAINED AND LEFT STENT PLACEMENT (Left)  SURGEON:  Surgeon(s) and Role:    * Alexis Frock, MD - Primary  PHYSICIAN ASSISTANT:   ASSISTANTS: none   ANESTHESIA:   general  EBL:  Total I/O In: 1012.5 [I.V.:1012.5] Out: -   BLOOD ADMINISTERED:none  DRAINS: none   LOCAL MEDICATIONS USED:  NONE  SPECIMEN:  Source of Specimen:  left ureteral stone fragments  DISPOSITION OF SPECIMEN:  [facility] for compositional analysis  COUNTS:  YES  TOURNIQUET:  * No tourniquets in log *  DICTATION: .Other Dictation: Dictation Number [id number]  PLAN OF CARE: Discharge to home after PACU  PATIENT DISPOSITION:  PACU - hemodynamically stable.   Delay start of Pharmacological VTE agent (>24hrs) due to surgical blood loss or risk of bleeding: not applicable

## 2015-12-09 NOTE — Anesthesia Preprocedure Evaluation (Addendum)
Anesthesia Evaluation  Patient identified by MRN, date of birth, ID band Patient awake    Reviewed: Allergy & Precautions, NPO status , Patient's Chart, lab work & pertinent test results  Airway Mallampati: II  TM Distance: >3 FB Neck ROM: Full    Dental   Pulmonary neg pulmonary ROS,    breath sounds clear to auscultation       Cardiovascular negative cardio ROS   Rhythm:Regular Rate:Normal     Neuro/Psych negative neurological ROS     GI/Hepatic negative GI ROS, Neg liver ROS,   Endo/Other  negative endocrine ROS  Renal/GU Renal disease     Musculoskeletal   Abdominal   Peds  Hematology negative hematology ROS (+) anemia ,   Anesthesia Other Findings   Reproductive/Obstetrics                            Lab Results  Component Value Date   WBC 10.1 12/09/2015   HGB 11.9 (L) 12/09/2015   HCT 35.0 (L) 12/09/2015   MCV 89.2 12/09/2015   PLT 230 12/09/2015   Lab Results  Component Value Date   CREATININE 1.20 (H) 12/09/2015   BUN 16 12/09/2015   NA 140 12/09/2015   K 3.8 12/09/2015   CL 102 12/09/2015    Anesthesia Physical Anesthesia Plan  ASA: II  Anesthesia Plan: General   Post-op Pain Management:    Induction: Intravenous  Airway Management Planned: LMA  Additional Equipment:   Intra-op Plan:   Post-operative Plan: Extubation in OR  Informed Consent: I have reviewed the patients History and Physical, chart, labs and discussed the procedure including the risks, benefits and alternatives for the proposed anesthesia with the patient or authorized representative who has indicated his/her understanding and acceptance.   Dental advisory given  Plan Discussed with:   Anesthesia Plan Comments:         Anesthesia Quick Evaluation

## 2015-12-10 ENCOUNTER — Encounter (HOSPITAL_COMMUNITY): Payer: Self-pay | Admitting: Urology

## 2015-12-10 NOTE — Discharge Summary (Signed)
Physician Discharge Summary  Patient ID: Megan Dalton MRN: [id number] DOB/AGE: March 23, 1966 50 y.o.  Admit date: 12/08/2015 Discharge date: 12/10/2015  Admission Diagnoses: Left Ureteral Stone  Discharge Diagnoses:  Active Problems:   Kidney stone   Ureteral stone Transient fever  Discharged Condition: good  Hospital Course:   1 - Left Ureteral Stone - pt admitted early AM 8/8 for 40mm left ureteral stone with refractory colic and underwent left ureteroscopy with laser lithotripsy and tethered stent placement later that day. Despite pre-op UA without infectious parameters she had transient fever with Tm 100.7 in recovery area concerning for possible early pyelonephritis. She was kept on Ancef and quickly defervesced. By the afternoon of 8/9, the day of discharge, she is ambulatory, pain controlled, maintaining PO hydration, afebrile for >12 hours and felt to be adequate for discharge. Final UCX pending as discharge.   Consults: None  Significant Diagnostic Studies: labs: UCX pending  Treatments: surgery: left ureteroscopy with laser lithotripsy and tethered stent placement  12/09/2015  Discharge Exam: Blood pressure (!) 100/58, pulse 85, temperature 98.6 F (37 C), temperature source Oral, resp. rate 15, height 5\' 2"  (1.575 m), weight 61.2 kg (135 lb), last menstrual period 12/03/2015, SpO2 93 %. General appearance: alert, cooperative and appears stated age Head: Normocephalic, without obvious abnormality, atraumatic Eyes: negative Nose: Nares normal. Septum midline. Mucosa normal. No drainage or sinus tenderness. Throat: lips, mucosa, and tongue normal; teeth and gums normal Neck: supple, symmetrical, trachea midline Back: symmetric, no curvature. ROM normal. No CVA tenderness. Resp: non-klabored on room air Cardio: regular rate and rhythm, S1, S2 normal, no murmur, click, rub or gallop GI: soft, non-tender; bowel sounds normal; no masses,  no organomegaly Extremities: extremities  normal, atraumatic, no cyanosis or edema Pulses: 2+ and symmetric Skin: Skin color, texture, turgor normal. No rashes or lesions Lymph nodes: Cervical, supraclavicular, and axillary nodes normal. Neurologic: Grossly normal  NO CVAT.  Tethered stent string in place on inner left thigh.  Disposition: Final discharge disposition not confirmed     Medication List    STOP taking these medications   diclofenac 50 MG EC tablet Commonly known as:  VOLTAREN   tamsulosin 0.4 MG Caps capsule Commonly known as:  FLOMAX     TAKE these medications   cephALEXin 500 MG capsule Commonly known as:  KEFLEX Take 1 capsule (500 mg total) by mouth 2 (two) times daily. X 3 days to prevent post-op infection with stent in place   diazepam 5 MG tablet Commonly known as:  VALIUM Take 2.5 mg by mouth every 6 (six) hours as needed for anxiety.   ketorolac 10 MG tablet Commonly known as:  TORADOL Take 1 tablet (10 mg total) by mouth every 6 (six) hours as needed for moderate pain (and stent discomfort post-operatively).   traMADol 50 MG tablet Commonly known as:  ULTRAM Take 1-2 tablets (50-100 mg total) by mouth every 6 (six) hours as needed for severe pain (post-operatively).      [facility]   Why:  We will call to arrange post-op visit within aroudn 2-3 weeks.  Contact information: [place] [address] [phone]           Signed: Alexis Frock 12/10/2015, 2:20 PM

## 2015-12-11 LAB — URINE CULTURE: CULTURE: NO GROWTH

## 2017-08-30 ENCOUNTER — Encounter: Payer: Self-pay | Admitting: Family Medicine

## 2017-09-20 IMAGING — CT CT RENAL STONE PROTOCOL
2 of 3 series · 15 of 46 positions shown, 17 images · non-contrast
Comparison: None.

CLINICAL DATA: Acute onset of worsening left flank pain, nausea and
vomiting. Initial encounter.

EXAM:
CT ABDOMEN AND PELVIS WITHOUT CONTRAST
TECHNIQUE: Multidetector CT imaging of the abdomen and pelvis was performed
following the standard protocol without IV contrast.

[Series 3: lung · axial · 0.64mm/px · z∈[+1442,+1524]mm · 12 of 47 slices shown, 14 images]
[im 3/47  soft-tissue]
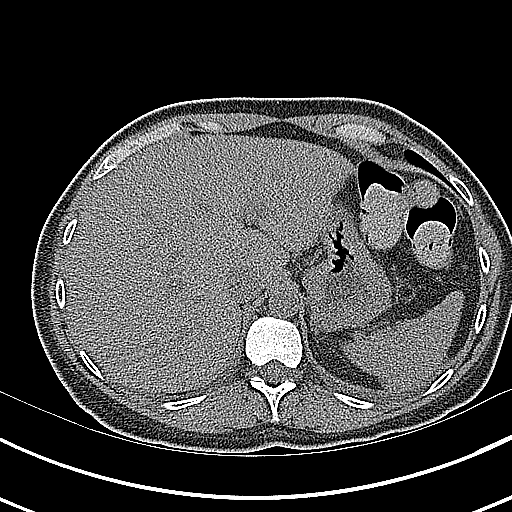
[im 3/47  bone]
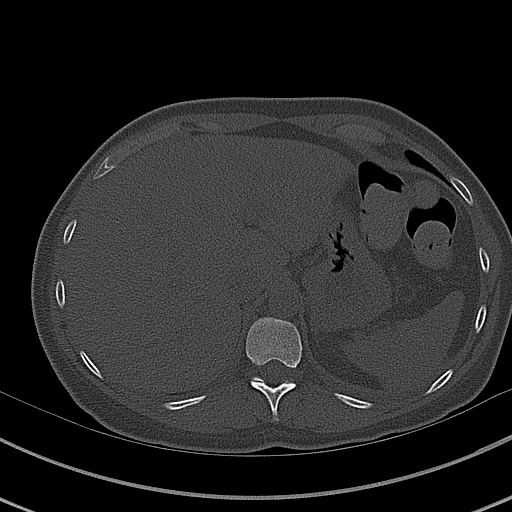
[im 6/47  soft-tissue]
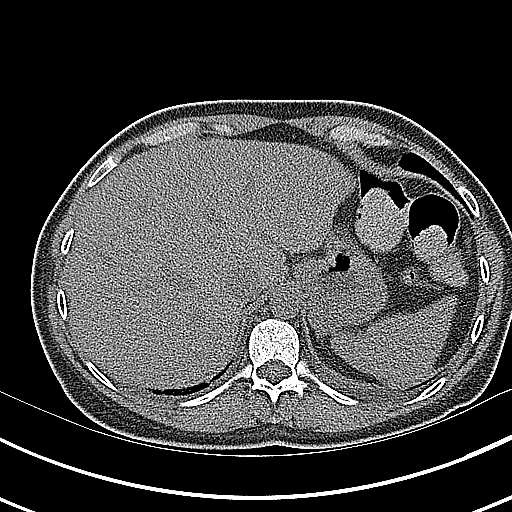
[im 11/47  soft-tissue]
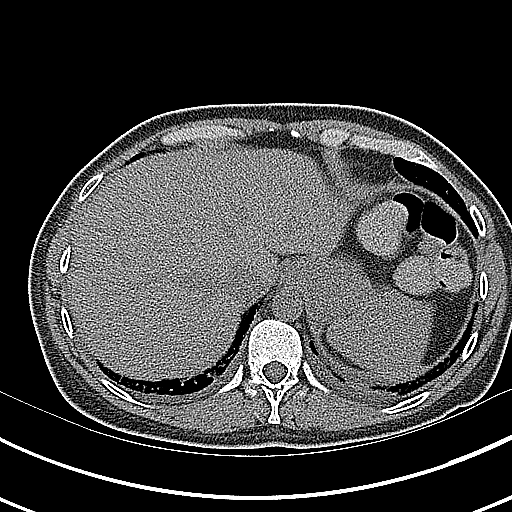
[im 14/47  soft-tissue]
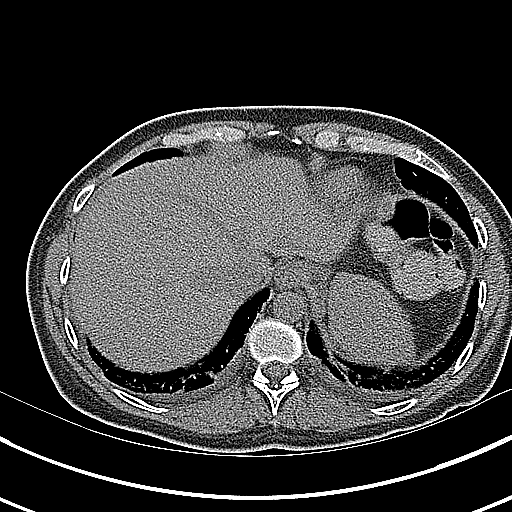
[im 18/47  soft-tissue]
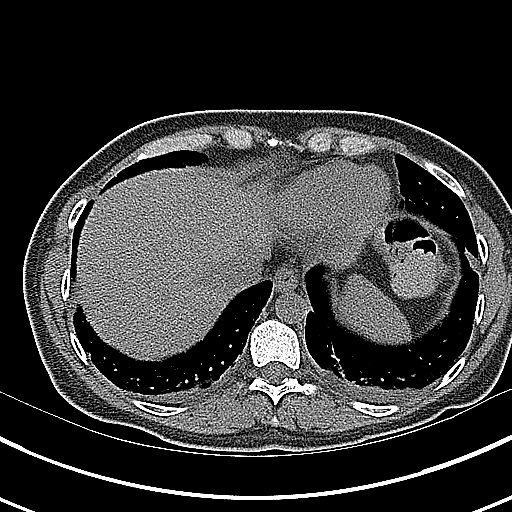
[im 21/47  soft-tissue]
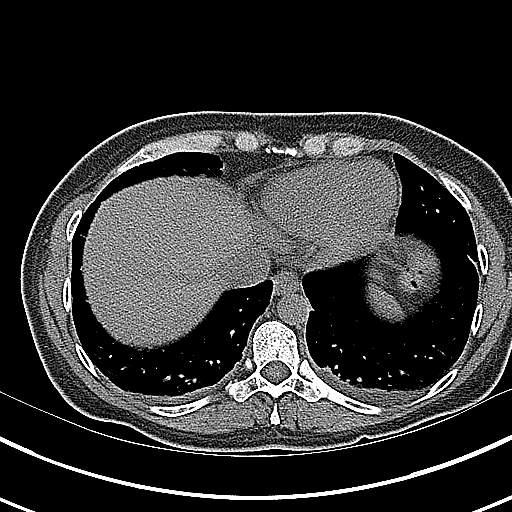
[im 26/47  soft-tissue]
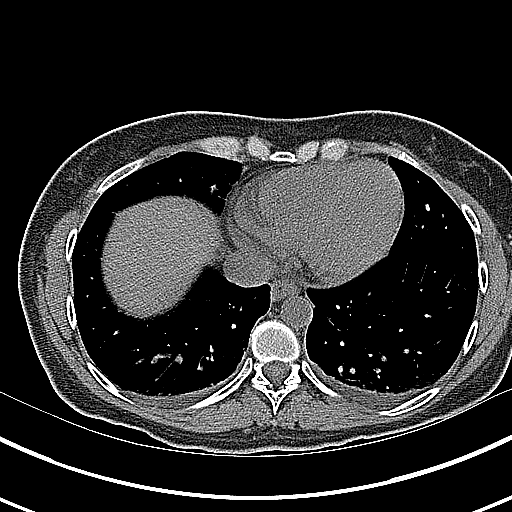
[im 29/47  soft-tissue]
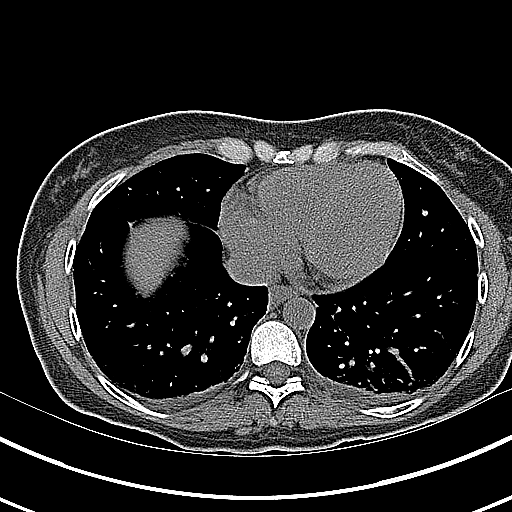
[im 33/47  soft-tissue]
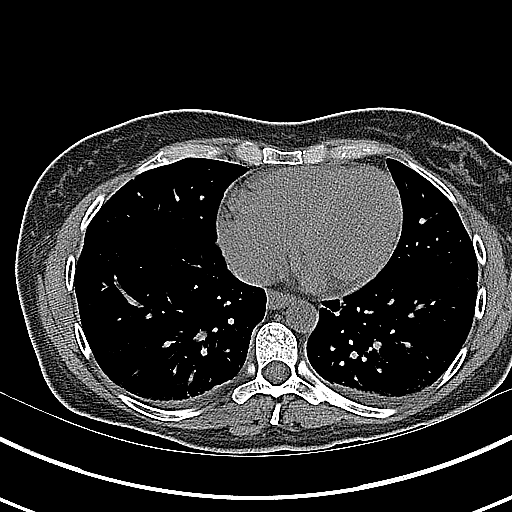
[im 33/47  bone]
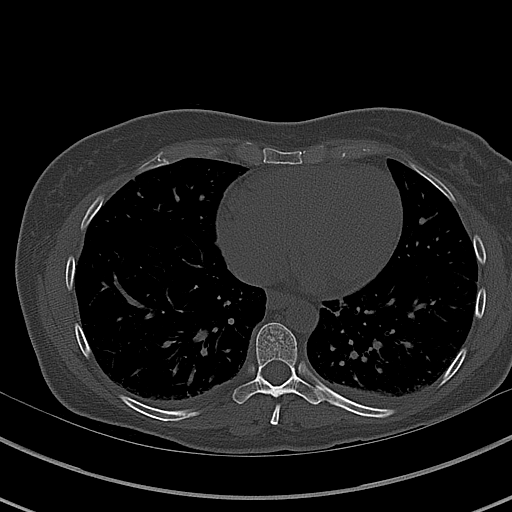
[im 36/47  soft-tissue]
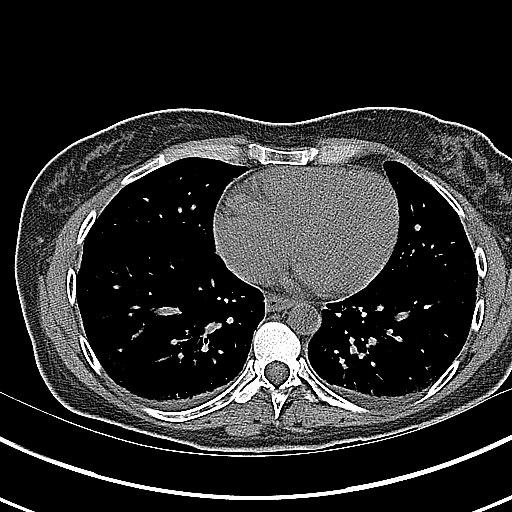
[im 41/47  soft-tissue]
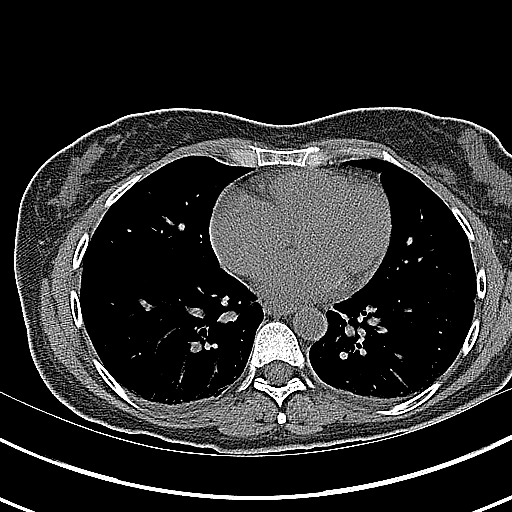
[im 44/47  soft-tissue]
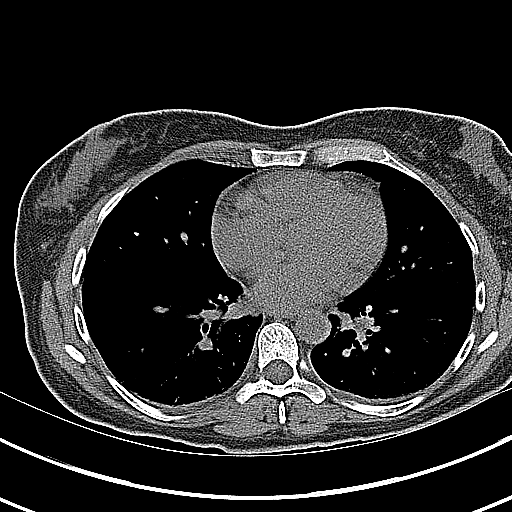

[Series 4: coronal · coronal · 0.55mm/px · 3 of 103 slices shown]
[im 35/103  soft-tissue]
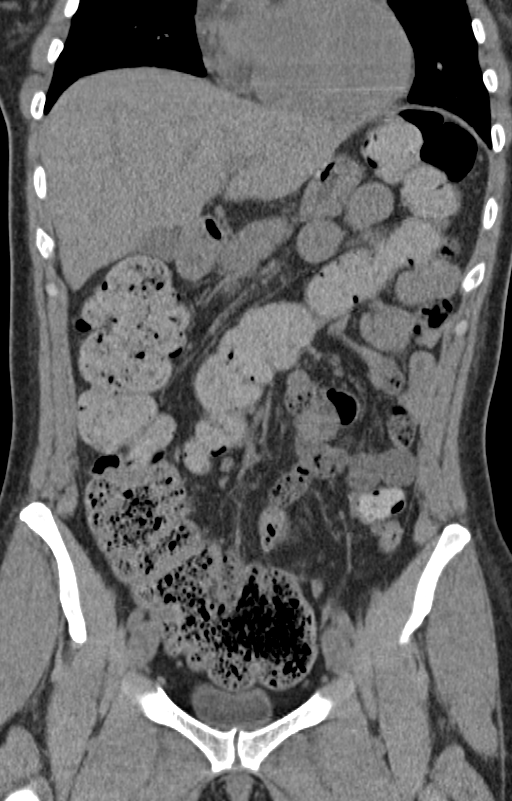
[im 46/103  soft-tissue]
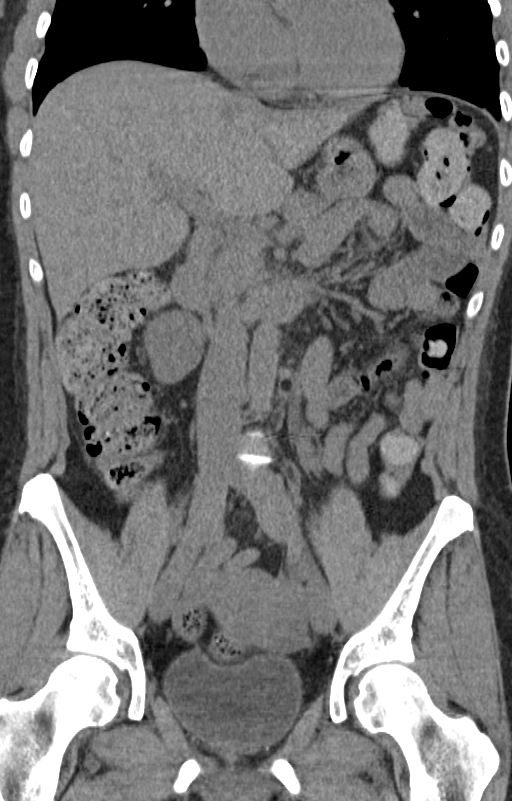
[im 57/103  soft-tissue]
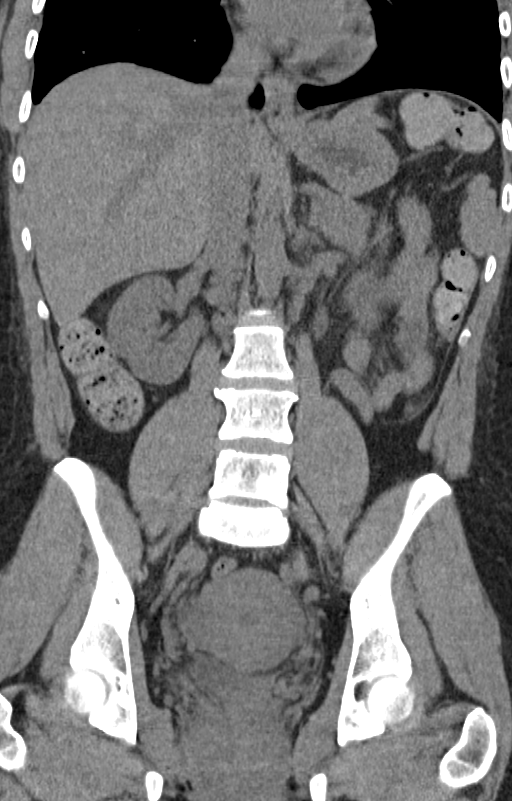

[15 of 46 positions shown; findings below may reference images not displayed]

FINDINGS: Trace bilateral pleural effusions are noted, with associated
atelectasis.

The liver and spleen are unremarkable in appearance. The gallbladder
is within normal limits. The pancreas and adrenal glands are
unremarkable.

Mild left-sided hydronephrosis is noted, with prominence of the left
ureter along much of its course, to the level of an obstructing 8 x
6 mm stone at the distal left ureter, 7 cm above the left
vesicoureteral junction. Nonobstructing right renal stones measure
up to 3 mm in size. No significant perinephric stranding is seen.

No free fluid is identified. The small bowel is unremarkable in
appearance. The stomach is within normal limits. No acute vascular
abnormalities are seen.

The appendix is normal in caliber, without evidence of appendicitis.
The colon is grossly unremarkable in appearance.

The bladder is mildly distended and grossly unremarkable. The uterus
is unremarkable in appearance. The ovaries are relatively symmetric.
No suspicious adnexal masses are seen. No inguinal lymphadenopathy
is seen.

No acute osseous abnormalities are identified. A limbus vertebra is
noted involving the anterior superior endplate of L4.
IMPRESSION: 1. Mild left-sided hydronephrosis, with an obstructing large 8 x 6
mm stone at the distal left ureter, 7 cm above the left
vesicoureteral junction. Given its size, this is less likely to pass
on its own.
2. Nonobstructing right renal stones measure up to 3 mm in size.
3. Limbus vertebra involving the anterior superior endplate of L4.

## 2020-02-29 ENCOUNTER — Other Ambulatory Visit: Payer: Self-pay | Admitting: Urology

## 2020-03-11 NOTE — Patient Instructions (Addendum)
DUE TO COVID-19 ONLY ONE VISITOR IS ALLOWED TO COME WITH YOU AND STAY IN THE WAITING ROOM ONLY DURING PRE OP AND PROCEDURE DAY OF SURGERY. THE 1 VISITOR  MAY VISIT WITH YOU AFTER SURGERY IN YOUR PRIVATE ROOM DURING VISITING HOURS ONLY!  YOU NEED TO HAVE A COVID 19 TEST ON: 03/18/20 @ 8:15 AM, THIS TEST MUST BE DONE BEFORE SURGERY,  COVID TESTING SITE [place] [address], IT IS ON THE RIGHT GOING OUT [address] APPROXIMATELY  2 MINUTES PAST [employer] ON THE RIGHT. ONCE YOUR COVID TEST IS COMPLETED,  PLEASE BEGIN THE QUARANTINE INSTRUCTIONS AS OUTLINED IN YOUR HANDOUT.                Megan Dalton    Your procedure is scheduled on: 03/21/20   Report to [facility] Main  Entrance   Report to short stay at : 5:30 AM     Call this number if you have problems the morning of surgery [phone]    Remember: Do not eat food or drink liquids :After Midnight.   BRUSH YOUR TEETH MORNING OF SURGERY AND RINSE YOUR MOUTH OUT, NO CHEWING GUM CANDY OR MINTS.              You may not have any metal on your body including hair pins and              piercings  Do not wear jewelry, make-up, lotions, powders or perfumes, deodorant             Do not wear nail polish on your fingernails.  Do not shave  48 hours prior to surgery.              Do not bring valuables to the hospital. [place].  Contacts, dentures or bridgework may not be worn into surgery.  Leave suitcase in the car. After surgery it may be brought to your room.     Patients discharged the day of surgery will not be allowed to drive home. IF YOU ARE HAVING SURGERY AND GOING HOME THE SAME DAY, YOU MUST HAVE AN ADULT TO DRIVE YOU HOME AND BE WITH YOU FOR 24 HOURS. YOU MAY GO HOME BY TAXI OR UBER OR ORTHERWISE, BUT AN ADULT MUST ACCOMPANY YOU HOME AND STAY WITH YOU FOR 24 HOURS.  Name and phone number of your driver:  Special Instructions: N/A               Please read over the following fact sheets you were given: _____________________________________________________________________         [facility] - Preparing for Surgery Before surgery, you can play an important role.  Because skin is not sterile, your skin needs to be as free of germs as possible.  You can reduce the number of germs on your skin by washing with CHG (chlorahexidine gluconate) soap before surgery.  CHG is an antiseptic cleaner which kills germs and bonds with the skin to continue killing germs even after washing. Please DO NOT use if you have an allergy to CHG or antibacterial soaps.  If your skin becomes reddened/irritated stop using the CHG and inform your nurse when you arrive at Short Stay. Do not shave (including legs and underarms) for at least 48 hours prior to the first CHG shower.  You may shave your face/neck. Please  follow these instructions carefully:  1.  Shower with CHG Soap the night before surgery and the  morning of Surgery.  2.  If you choose to wash your hair, wash your hair first as usual with your  normal  shampoo.  3.  After you shampoo, rinse your hair and body thoroughly to remove the  shampoo.                           4.  Use CHG as you would any other liquid soap.  You can apply chg directly  to the skin and wash                       Gently with a scrungie or clean washcloth.  5.  Apply the CHG Soap to your body ONLY FROM THE NECK DOWN.   Do not use on face/ open                           Wound or open sores. Avoid contact with eyes, ears mouth and genitals (private parts).                       Wash face,  Genitals (private parts) with your normal soap.             6.  Wash thoroughly, paying special attention to the area where your surgery  will be performed.  7.  Thoroughly rinse your body with warm water from the neck down.  8.  DO NOT shower/wash with your normal soap after using and rinsing off  the CHG Soap.                9.  Pat  yourself dry with a clean towel.            10.  Wear clean pajamas.            11.  Place clean sheets on your bed the night of your first shower and do not  sleep with pets. Day of Surgery : Do not apply any lotions/deodorants the morning of surgery.  Please wear clean clothes to the hospital/surgery center.  FAILURE TO FOLLOW THESE INSTRUCTIONS MAY RESULT IN THE CANCELLATION OF YOUR SURGERY PATIENT SIGNATURE_________________________________  NURSE SIGNATURE__________________________________  ________________________________________________________________________

## 2020-03-12 ENCOUNTER — Encounter (HOSPITAL_COMMUNITY)
Admission: RE | Admit: 2020-03-12 | Discharge: 2020-03-12 | Disposition: A | Discharge status: Home / Self Care | Payer: BC Managed Care – PPO | Source: Ambulatory Visit | Attending: Urology | Admitting: Urology

## 2020-03-12 ENCOUNTER — Other Ambulatory Visit: Payer: Self-pay

## 2020-03-12 ENCOUNTER — Encounter (HOSPITAL_COMMUNITY): Payer: Self-pay

## 2020-03-12 DIAGNOSIS — Z01818 Encounter for other preprocedural examination: Secondary | ICD-10-CM | POA: Diagnosis present

## 2020-03-12 HISTORY — DX: Personal history of urinary calculi: Z87.442

## 2020-03-12 HISTORY — DX: Other specified postprocedural states: Z98.890

## 2020-03-12 HISTORY — DX: Other complications of anesthesia, initial encounter: T88.59XA

## 2020-03-12 HISTORY — DX: Nausea with vomiting, unspecified: R11.2

## 2020-03-12 LAB — CBC
HCT: 37.4 % (ref 36.0–46.0)
Hemoglobin: 12.5 g/dL (ref 12.0–15.0)
MCH: 31.1 pg (ref 26.0–34.0)
MCHC: 33.4 g/dL (ref 30.0–36.0)
MCV: 93 fL (ref 80.0–100.0)
Platelets: 243 10*3/uL (ref 150–400)
RBC: 4.02 MIL/uL (ref 3.87–5.11)
RDW: 12.1 % (ref 11.5–15.5)
WBC: 6 10*3/uL (ref 4.0–10.5)
nRBC: 0 % (ref 0.0–0.2)

## 2020-03-12 NOTE — Progress Notes (Addendum)
COVID Vaccine Completed: Yes Date COVID Vaccine completed: 03/02/20 COVID vaccine manufacturer: Pfizer: [name]   PCP - Linna Darner Cardiologist -   Chest x-ray -  EKG -  Stress Test -  ECHO -  Cardiac Cath -  Pacemaker/ICD device last checked:  Sleep Study -  CPAP -   Fasting Blood Sugar -  Checks Blood Sugar _____ times a day  Blood Thinner Instructions: Aspirin Instructions: Last Dose:  Anesthesia review:   Patient denies shortness of breath, fever, cough and chest pain at PAT appointment   Patient verbalized understanding of instructions that were given to them at the PAT appointment. Patient was also instructed that they will need to review over the PAT instructions again at home before surgery.

## 2020-03-18 ENCOUNTER — Other Ambulatory Visit (HOSPITAL_COMMUNITY)
Admission: RE | Admit: 2020-03-18 | Discharge: 2020-03-18 | Disposition: A | Discharge status: Home / Self Care | Payer: BC Managed Care – PPO | Source: Ambulatory Visit | Attending: Urology | Admitting: Urology

## 2020-03-18 DIAGNOSIS — Z20822 Contact with and (suspected) exposure to covid-19: Secondary | ICD-10-CM | POA: Insufficient documentation

## 2020-03-18 DIAGNOSIS — Z01812 Encounter for preprocedural laboratory examination: Secondary | ICD-10-CM | POA: Insufficient documentation

## 2020-03-18 DIAGNOSIS — R310 Gross hematuria: Secondary | ICD-10-CM | POA: Diagnosis not present

## 2020-03-18 DIAGNOSIS — N200 Calculus of kidney: Secondary | ICD-10-CM | POA: Diagnosis present

## 2020-03-18 LAB — SARS CORONAVIRUS 2 (TAT 6-24 HRS): SARS Coronavirus 2: NEGATIVE

## 2020-03-20 NOTE — Anesthesia Preprocedure Evaluation (Addendum)
Anesthesia Evaluation  Patient identified by MRN, date of birth, ID band Patient awake    Reviewed: Allergy & Precautions, NPO status , Patient's Chart, lab work & pertinent test results  History of Anesthesia Complications (+) PONV and history of anesthetic complications  Airway Mallampati: II  TM Distance: >3 FB Neck ROM: Full    Dental no notable dental hx.    Pulmonary neg pulmonary ROS,    Pulmonary exam normal breath sounds clear to auscultation       Cardiovascular negative cardio ROS Normal cardiovascular exam Rhythm:Regular Rate:Normal     Neuro/Psych negative neurological ROS  negative psych ROS   GI/Hepatic negative GI ROS, Neg liver ROS,   Endo/Other  negative endocrine ROS  Renal/GU Renal disease     Musculoskeletal negative musculoskeletal ROS (+)   Abdominal   Peds  Hematology negative hematology ROS (+)   Anesthesia Other Findings  BILATERAL RENAL STONES  Reproductive/Obstetrics                            Anesthesia Physical Anesthesia Plan  ASA: II  Anesthesia Plan: General   Post-op Pain Management:    Induction: Intravenous  PONV Risk Score and Plan: 4 or greater and Ondansetron, Dexamethasone, Propofol infusion, Treatment may vary due to age or medical condition and Midazolam  Airway Management Planned: LMA  Additional Equipment:   Intra-op Plan:   Post-operative Plan: Extubation in OR  Informed Consent: I have reviewed the patients History and Physical, chart, labs and discussed the procedure including the risks, benefits and alternatives for the proposed anesthesia with the patient or authorized representative who has indicated his/her understanding and acceptance.     Dental advisory given  Plan Discussed with: CRNA  Anesthesia Plan Comments:       Anesthesia Quick Evaluation

## 2020-03-21 ENCOUNTER — Encounter (HOSPITAL_COMMUNITY): Payer: Self-pay | Admitting: Urology

## 2020-03-21 ENCOUNTER — Ambulatory Visit (HOSPITAL_COMMUNITY): Payer: BC Managed Care – PPO | Admitting: Anesthesiology

## 2020-03-21 ENCOUNTER — Ambulatory Visit (HOSPITAL_COMMUNITY): Payer: BC Managed Care – PPO

## 2020-03-21 ENCOUNTER — Encounter (HOSPITAL_COMMUNITY)
Admission: RE | Disposition: A | Discharge status: Home / Self Care | Payer: Self-pay | Source: Ambulatory Visit | Attending: Urology

## 2020-03-21 ENCOUNTER — Ambulatory Visit (HOSPITAL_COMMUNITY)
Admission: RE | Admit: 2020-03-21 | Discharge: 2020-03-21 | Disposition: A | Discharge status: Home / Self Care | Payer: BC Managed Care – PPO | Source: Ambulatory Visit | Attending: Urology | Admitting: Urology

## 2020-03-21 DIAGNOSIS — N200 Calculus of kidney: Secondary | ICD-10-CM | POA: Diagnosis not present

## 2020-03-21 DIAGNOSIS — R310 Gross hematuria: Secondary | ICD-10-CM | POA: Insufficient documentation

## 2020-03-21 DIAGNOSIS — Z20822 Contact with and (suspected) exposure to covid-19: Secondary | ICD-10-CM | POA: Insufficient documentation

## 2020-03-21 HISTORY — PX: CYSTOSCOPY WITH RETROGRADE PYELOGRAM, URETEROSCOPY AND STENT PLACEMENT: SHX5789

## 2020-03-21 LAB — PREGNANCY, URINE: Preg Test, Ur: NEGATIVE

## 2020-03-21 SURGERY — CYSTOURETEROSCOPY, WITH RETROGRADE PYELOGRAM AND STENT INSERTION
Anesthesia: General | Laterality: Bilateral

## 2020-03-21 MED ORDER — GENTAMICIN SULFATE 40 MG/ML IJ SOLN
5.0000 mg/kg | INTRAVENOUS | Status: AC
  Administered 2020-03-21: 300 mg via INTRAVENOUS
  Filled 2020-03-21: qty 7.5

## 2020-03-21 MED ORDER — EPHEDRINE SULFATE 50 MG/ML IJ SOLN
INTRAMUSCULAR | Status: DC | PRN
  Administered 2020-03-21: 10 mg via INTRAVENOUS
  Administered 2020-03-21: 5 mg via INTRAVENOUS
  Administered 2020-03-21: 10 mg via INTRAVENOUS

## 2020-03-21 MED ORDER — LIDOCAINE 2% (20 MG/ML) 5 ML SYRINGE
INTRAMUSCULAR | Status: AC
  Filled 2020-03-21: qty 5

## 2020-03-21 MED ORDER — FENTANYL CITRATE (PF) 100 MCG/2ML IJ SOLN
INTRAMUSCULAR | Status: AC
  Administered 2020-03-21: 25 ug via INTRAVENOUS
  Filled 2020-03-21: qty 2

## 2020-03-21 MED ORDER — FENTANYL CITRATE (PF) 100 MCG/2ML IJ SOLN
INTRAMUSCULAR | Status: DC | PRN
  Administered 2020-03-21: 50 ug via INTRAVENOUS

## 2020-03-21 MED ORDER — PROPOFOL 10 MG/ML IV BOLUS
INTRAVENOUS | Status: AC
  Filled 2020-03-21: qty 20

## 2020-03-21 MED ORDER — KETOROLAC TROMETHAMINE 30 MG/ML IJ SOLN: 30.0000 mg | Freq: Once | INTRAMUSCULAR | Status: AC | PRN

## 2020-03-21 MED ORDER — LIDOCAINE HCL (CARDIAC) PF 100 MG/5ML IV SOSY
PREFILLED_SYRINGE | INTRAVENOUS | Status: DC | PRN
  Administered 2020-03-21: 60 mg via INTRATRACHEAL

## 2020-03-21 MED ORDER — ONDANSETRON HCL 4 MG/2ML IJ SOLN
INTRAMUSCULAR | Status: DC | PRN
  Administered 2020-03-21: 4 mg via INTRAVENOUS

## 2020-03-21 MED ORDER — OXYCODONE HCL 5 MG PO TABS
ORAL_TABLET | ORAL | Status: AC
  Administered 2020-03-21: 5 mg via ORAL
  Filled 2020-03-21: qty 1

## 2020-03-21 MED ORDER — ACETAMINOPHEN 500 MG PO TABS
1000.0000 mg | ORAL_TABLET | Freq: Once | ORAL | Status: AC
  Administered 2020-03-21: 1000 mg via ORAL
  Filled 2020-03-21: qty 2

## 2020-03-21 MED ORDER — PROMETHAZINE HCL 25 MG/ML IJ SOLN: 6.2500 mg | INTRAMUSCULAR | Status: DC | PRN

## 2020-03-21 MED ORDER — ONDANSETRON HCL 4 MG/2ML IJ SOLN
INTRAMUSCULAR | Status: AC
  Filled 2020-03-21: qty 2

## 2020-03-21 MED ORDER — CHLORHEXIDINE GLUCONATE 0.12 % MT SOLN
15.0000 mL | Freq: Once | OROMUCOSAL | Status: AC
  Administered 2020-03-21: 15 mL via OROMUCOSAL

## 2020-03-21 MED ORDER — EPHEDRINE 5 MG/ML INJ
INTRAVENOUS | Status: AC
  Filled 2020-03-21: qty 10

## 2020-03-21 MED ORDER — KETOROLAC TROMETHAMINE 10 MG PO TABS: 10.0000 mg | ORAL_TABLET | Freq: Three times a day (TID) | ORAL | 0 refills | Status: DC | PRN

## 2020-03-21 MED ORDER — KETOROLAC TROMETHAMINE 30 MG/ML IJ SOLN
INTRAMUSCULAR | Status: AC
  Administered 2020-03-21: 30 mg via INTRAVENOUS
  Filled 2020-03-21: qty 1

## 2020-03-21 MED ORDER — OXYCODONE HCL 5 MG PO TABS: 5.0000 mg | ORAL_TABLET | Freq: Once | ORAL | Status: AC

## 2020-03-21 MED ORDER — DEXAMETHASONE SODIUM PHOSPHATE 10 MG/ML IJ SOLN
INTRAMUSCULAR | Status: DC | PRN
  Administered 2020-03-21: 10 mg via INTRAVENOUS

## 2020-03-21 MED ORDER — TRAMADOL HCL 50 MG PO TABS
50.0000 mg | ORAL_TABLET | Freq: Four times a day (QID) | ORAL | 0 refills | Status: AC | PRN
Start: 2020-03-21 — End: 2021-03-21

## 2020-03-21 MED ORDER — PROPOFOL 10 MG/ML IV BOLUS
INTRAVENOUS | Status: DC | PRN
  Administered 2020-03-21: 200 mg via INTRAVENOUS

## 2020-03-21 MED ORDER — LACTATED RINGERS IV SOLN: INTRAVENOUS | Status: DC

## 2020-03-21 MED ORDER — CEPHALEXIN 500 MG PO CAPS: 500.0000 mg | ORAL_CAPSULE | Freq: Two times a day (BID) | ORAL | 0 refills | Status: DC

## 2020-03-21 MED ORDER — SODIUM CHLORIDE 0.9 % IR SOLN
Status: DC | PRN
  Administered 2020-03-21: 6000 mL

## 2020-03-21 MED ORDER — FENTANYL CITRATE (PF) 100 MCG/2ML IJ SOLN
INTRAMUSCULAR | Status: AC
  Filled 2020-03-21: qty 2

## 2020-03-21 MED ORDER — MIDAZOLAM HCL 2 MG/2ML IJ SOLN
INTRAMUSCULAR | Status: AC
  Filled 2020-03-21: qty 2

## 2020-03-21 MED ORDER — ORAL CARE MOUTH RINSE: 15.0000 mL | Freq: Once | OROMUCOSAL | Status: AC

## 2020-03-21 MED ORDER — DEXAMETHASONE SODIUM PHOSPHATE 10 MG/ML IJ SOLN
INTRAMUSCULAR | Status: AC
  Filled 2020-03-21: qty 1

## 2020-03-21 MED ORDER — FENTANYL CITRATE (PF) 100 MCG/2ML IJ SOLN: 25.0000 ug | INTRAMUSCULAR | Status: DC | PRN

## 2020-03-21 MED ORDER — IOHEXOL 300 MG/ML  SOLN
INTRAMUSCULAR | Status: DC | PRN
  Administered 2020-03-21: 27 mL

## 2020-03-21 SURGICAL SUPPLY — 24 items
BAG URO CATCHER STRL LF (MISCELLANEOUS) ×3 IMPLANT
BASKET LASER NITINOL 1.9FR (BASKET) ×3 IMPLANT
CATH INTERMIT  6FR 70CM (CATHETERS) ×3 IMPLANT
CLOTH BEACON ORANGE TIMEOUT ST (SAFETY) ×3 IMPLANT
EXTRACTOR STONE 1.7FRX115CM (UROLOGICAL SUPPLIES) IMPLANT
GLOVE BIOGEL M STRL SZ7.5 (GLOVE) ×3 IMPLANT
GOWN STRL REUS W/TWL LRG LVL3 (GOWN DISPOSABLE) ×3 IMPLANT
GUIDEWIRE ANG ZIPWIRE 038X150 (WIRE) ×6 IMPLANT
GUIDEWIRE STR DUAL SENSOR (WIRE) ×6 IMPLANT
KIT TURNOVER KIT A (KITS) IMPLANT
LASER FIB FLEXIVA PULSE ID 365 (Laser) IMPLANT
LASER FIB FLEXIVA PULSE ID 550 (Laser) IMPLANT
LASER FIB FLEXIVA PULSE ID 910 (Laser) IMPLANT
MANIFOLD NEPTUNE II (INSTRUMENTS) ×3 IMPLANT
PACK CYSTO (CUSTOM PROCEDURE TRAY) ×3 IMPLANT
SHEATH URETERAL 12FRX28CM (UROLOGICAL SUPPLIES) ×3 IMPLANT
SHEATH URETERAL 12FRX35CM (MISCELLANEOUS) IMPLANT
STENT POLARIS 5FRX22 (STENTS) ×6 IMPLANT
TRACTIP FLEXIVA PULS ID 200XHI (Laser) IMPLANT
TRACTIP FLEXIVA PULSE ID 200 (Laser)
TUBE FEEDING 8FR 16IN STR KANG (MISCELLANEOUS) ×6 IMPLANT
TUBING CONNECTING 10 (TUBING) ×2 IMPLANT
TUBING CONNECTING 10' (TUBING) ×1
TUBING UROLOGY SET (TUBING) ×3 IMPLANT

## 2020-03-21 NOTE — H&P (Signed)
Megan Dalton is an 54 y.o. female.    Chief Complaint: Pre-Op BILATERAL Ureteroscopic Stone Manipulation  HPI:   1 - Recurrent Nephrolithiasis -  Pre 2017 - URS x 1, MET x "many"  12/2015- left URS / stent to stone free.  08/2016 - KUB Likely Rt UVJ stone given medical therapy  02/2020 - CT bilateral NON-obstructing renal stones (about 58m heach side)    PMH unremarkable. She travels internationally with her job at [employer] Her PCP is DLinna DarnerMD with [facility] Rossetti   Today "AVaness is seen to proceed with BILATERAL ureteroscopy for her bilateral renal stones in setting of recurrent hematuria and high risk travel. C19 screen negative. Most recent UA without infectious parameters.   Past Medical History:  Diagnosis Date  . Complication of anesthesia   . History of kidney stones   . Kidney stones   . Nephrolithiasis    since age 54   Calcium oxalate.  .Marland KitchenPONV (postoperative nausea and vomiting)     Past Surgical History:  Procedure Laterality Date  . CYSTOSCOPY WITH RETROGRADE PYELOGRAM, URETEROSCOPY AND STENT PLACEMENT Left 12/09/2015   Procedure: CYSTOSCOPY WITH LEFT RETROGRADE PYELOGRAM, LEFT URETEROSCOPY STONE MANIPULATION WITH STONE OBTAINED AND LEFT STENT PLACEMENT;  Surgeon: TAlexis Frock MD;  Location: [facility];  Service: Urology;  Laterality: Left;  . DILATION AND CURETTAGE OF UTERUS    . LITHOTRIPSY  2008   approx  . URETEROSCOPY  1997    History reviewed. No pertinent family history. Social History:  reports that she has never smoked. She has never used smokeless tobacco. She reports that she does not drink alcohol and does not use drugs.  Allergies:  Allergies  Allergen Reactions  . Hydrocodone Nausea And Vomiting  . Morphine And Related Nausea And Vomiting    Can take with anti nausea medication    Medications Prior to Admission  Medication Sig Dispense Refill  . Melatonin 1 MG CAPS Take 2 mg by mouth at bedtime.      Results for orders placed or performed  during the hospital encounter of 03/21/20 (from the past 48 hour(s))  Pregnancy, urine     Status: None   Collection Time: 03/21/20  5:53 AM  Result Value Ref Range   Preg Test, Ur NEGATIVE NEGATIVE    Comment:        THE SENSITIVITY OF THIS METHODOLOGY IS >20 mIU/mL. [facility]formed [facility] [address], [place] [address]   No results found.  Review of Systems  Constitutional: Negative.  Negative for chills and fever.  Genitourinary: Positive for hematuria.    Blood pressure (!) 100/56, pulse 62, temperature 98.2 F (36.8 C), temperature source Oral, resp. rate 16, last menstrual period 02/26/2020, SpO2 100 %. Physical Exam Vitals reviewed.  HENT:     Head: Normocephalic.     Nose: Nose normal.  Eyes:     Pupils: Pupils are equal, round, and reactive to light.  Cardiovascular:     Rate and Rhythm: Normal rate.     Pulses: Normal pulses.  Pulmonary:     Effort: Pulmonary effort is normal.  Abdominal:     General: Abdomen is flat.  Genitourinary:    Comments: NO CVAT at present.  Musculoskeletal:        General: Normal range of motion.     Cervical back: Normal range of motion.  Skin:    General: Skin is warm.  Neurological:     General: No focal  deficit present.     Mental Status: She is alert.  Psychiatric:        Mood and Affect: Mood normal.      Assessment/Plan  Proceed as planned with BILATERAL ureteroscopy with goal of stone free. Risks, benefits, alternatives, expected peri-op course discsused previously and reiterated today in cluding need for temporary stents given bilateral procedure.   Alexis Frock, MD 03/21/2020, 6:32 AM

## 2020-03-21 NOTE — Op Note (Signed)
Megan Dalton, Megan Dalton MEDICAL RECORD [id number] ACCOUNT [id number] DATE OF BIRTH:[birth year] FACILITY: [facility] LOCATION: [facility] PHYSICIAN:Shermika Balthaser, MD  OPERATIVE REPORT  DATE OF PROCEDURE:  03/21/2020  PREOPERATIVE DIAGNOSIS:  Bilateral renal stones, gross hematuria, occasional renal colic.  PROCEDURES: 1.  Cystoscopy, bilateral retrograde pyelograms, interpretation. 2.  Bilateral ureteroscopy with basket of stones. 3.  Insertion of bilateral ureteral stents, 5 x 22 Polaris with tether.  ESTIMATED BLOOD LOSS:  Nil.  MEDICATIONS:  None.  SPECIMEN:  Bilateral renal stones given to the patient.  FINDINGS: 1.  Bilateral relatively small volume renal stones approximately 4 mm each side. 2.  Complete resolution of all accessible stone fragments larger than one-third mm following basket extraction. 3.  Successful placement of bilateral ureteral stents, proximal end in renal pelvis, distal end in urinary bladder.  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  She was found to have new gross hematuria occasionally, especially after running.  She was  referred for evaluation and found to have recurrent relatively small volume bilateral renal stones.  She also endorses occasional flank pain.  Options were discussed for management including observation versus shockwave lithotripsy versus ureteroscopy  with goal of stone free and she wished to proceed with the latter given her extensive foreign travel and this also to complete her hematuria workup.  Informed consent was obtained and placed in medical record.  DESCRIPTION OF PROCEDURE:  The patient being identified, the procedure being bilateral ureteroscopy was confirmed.  Procedure timeout was performed.  Intravenous antibiotics were administered.  General anesthesia induced.  The patient was placed into a  low lithotomy  position, sterile field was created prepped and draped the patient's vagina, introitus and proximal thighs using iodine.  Cystourethroscopy was performed.  A 21-French rigid cystoscope with offset lens.  Inspection of bladder revealed no  diverticula, calcifications or papillary lesions.  Ureteral orifices were singleton.  The left ureteral orifice was cannulated with 6-French renal catheter and left retrograde pyelogram was obtained.  Left retrograde pyelogram demonstrated a single left ureter, single system left kidney.  No filling defects or narrowing noted.  A 0.038 ZIPwire was advanced to lower pole and set aside as a safety wire.  Next, right retrograde pyelogram was obtained.  Right retrograde pyelogram demonstrated a single right ureter, single system right kidney.  No filling defects or narrowing noted.  A separate ZIPwire was advanced to lower pole on the right side and set aside as a safety wire.  An 8-French feeding tube  was placed in the urinary bladder for pressure release, and semirigid ureteroscopy was performed distal four-fifths of the right ureter alongside a separate sensor working wire.  No mucosal masses were found.  Next, semirigid ureteroscopy was performed  of the distal orifice of the left ureter alongside a separate sensor working wire.  No mucosal findings were found.  The semirigid scope was exchanged for a 12/14 short length ureteral access sheath to the level of the proximal ureter using continuous  fluoroscopic guidance and flexible digital ureteroscopy performed in the proximal left ureter and systematic inspection of left kidney, including all calyces x3.  There was a dominant papillary tip calcification in a mid pole calyx.  This was amenable to  simple basketing.  It was grasped with escape basket, removed and set aside to be given to the patient.  Following this, the entire kidney was inspected again including all calyces x2.  There  was complete resolution of all  accessible stone fragments.   Access sheath was removed under continuous vision, no mucosal abnormalities were found.  Next, the access sheath was placed over the right sensor working wire to the level of the proximal right ureter using continuous fluoroscopic guidance and flexible  digital ureteroscopy performed of the proximal right ureter and systematic inspection of the right kidney.  There was a free floating stone that a lower mid calyx also appeared amenable to simple basketing.  It was grasped and escape basket, removed and  set aside to be given to the patient.  Following this, complete resolution of all accessible stone fragments on the right side access sheath was removed under continuous vision, no mucosal masses were found.  Given bilateral access sheath usage it was  felt that brief interval stenting with tethered stents be warranted.  As such, a new 5 x 22 Polaris type stents were placed bilaterally over the remaining safety wire using fluoroscopic guidance.  Good proximal and distal plane were noted.  Tethers were  left in place, fashioned together, trimmed to length and tucked per vagina and the procedure was terminated.  The patient tolerated the procedure well.  No immediate complications.  The patient was taken to postanesthesia care in stable condition.  Plan for discharge home.  HN/NUANCE  D:03/21/2020 T:03/21/2020 JOB:[id number]

## 2020-03-21 NOTE — Anesthesia Procedure Notes (Signed)
Procedure Name: LMA Insertion Date/Time: 03/21/2020 7:19 AM Performed by: Maxwell Caul, CRNA Pre-anesthesia Checklist: Patient identified, Emergency Drugs available, Suction available and Patient being monitored Patient Re-evaluated:Patient Re-evaluated prior to induction Oxygen Delivery Method: Circle system utilized Preoxygenation: Pre-oxygenation with 100% oxygen Induction Type: IV induction LMA: LMA inserted and LMA flexible inserted LMA Size: 4.0 Number of attempts: 1 Tube secured with: Tape Comments: Placed by Hilda Blades

## 2020-03-21 NOTE — Discharge Instructions (Signed)
1 - You may have urinary urgency (bladder spasms) and bloody urine on / off with stent in place. This is normal.  2 - Remove stents on Monday morning at home by pulling on strings, then blue-white plastic tubing, and discarding. Office is open Monday if problems arise.   3 - Call MD or go to ER for fever >102, severe pain / nausea / vomiting not relieved by medications, or acute change in medical status

## 2020-03-21 NOTE — Transfer of Care (Signed)
Immediate Anesthesia Transfer of Care Note  Patient: Megan Dalton  Procedure(s) Performed: CYSTOSCOPY WITH BILATERAL RETROGRADE PYELOGRAM URETEROSCOPY ,BASKET STONE EXTRACTION AND BILATERAL STENT PLACEMENT (Bilateral )  Patient Location: PACU  Anesthesia Type:General  Level of Consciousness: awake, alert  and oriented  Airway & Oxygen Therapy: Patient Spontanous Breathing and Patient connected to face mask oxygen  Post-op Assessment: Report given to RN and Post -op Vital signs reviewed and stable  Post vital signs: Reviewed and stable  Last Vitals:  Vitals Value Taken Time  BP 123/64 03/21/20 0810  Temp    Pulse 83 03/21/20 0811  Resp 19 03/21/20 0811  SpO2 100 % 03/21/20 0811  Vitals shown include unvalidated device data.  Last Pain:  Vitals:   03/21/20 0617  TempSrc: Oral         Complications: No complications documented.

## 2020-03-21 NOTE — Brief Op Note (Signed)
03/21/2020  7:59 AM  PATIENT:  Megan Dalton  54 y.o. female  PRE-OPERATIVE DIAGNOSIS:  BILATERAL RENAL STONES  POST-OPERATIVE DIAGNOSIS:  BILATERAL RENAL STONES  PROCEDURE:  Procedure(s) with comments: CYSTOSCOPY WITH BILATERAL RETROGRADE PYELOGRAM URETEROSCOPY ,BASKET STONE EXTRACTION AND BILATERAL STENT PLACEMENT (Bilateral) - 75 MINS  SURGEON:  Surgeon(s) and Role:    * Alexis Frock, MD - Primary  PHYSICIAN ASSISTANT:   ASSISTANTS: none   ANESTHESIA:   general  EBL:  minimal   BLOOD ADMINISTERED:none  DRAINS: none   LOCAL MEDICATIONS USED:  NONE  SPECIMEN:  Source of Specimen:  bilateral renal stones  DISPOSITION OF SPECIMEN:  given to patient  COUNTS:  YES  TOURNIQUET:  * No tourniquets in log *  DICTATION: .Other Dictation: Dictation Number [id number]  PLAN OF CARE: Discharge to home after PACU  PATIENT DISPOSITION:  PACU - hemodynamically stable.   Delay start of Pharmacological VTE agent (>24hrs) due to surgical blood loss or risk of bleeding: yes

## 2020-03-23 NOTE — Anesthesia Postprocedure Evaluation (Signed)
Anesthesia Post Note  Patient: Megan Dalton  Procedure(s) Performed: CYSTOSCOPY WITH BILATERAL RETROGRADE PYELOGRAM URETEROSCOPY ,BASKET STONE EXTRACTION AND BILATERAL STENT PLACEMENT (Bilateral )     Patient location during evaluation: PACU Anesthesia Type: General Level of consciousness: awake and alert Pain management: pain level controlled Vital Signs Assessment: post-procedure vital signs reviewed and stable Respiratory status: spontaneous breathing, nonlabored ventilation, respiratory function stable and patient connected to nasal cannula oxygen Cardiovascular status: blood pressure returned to baseline and stable Postop Assessment: no apparent nausea or vomiting Anesthetic complications: no   No complications documented.  Last Vitals:  Vitals:   03/21/20 0915 03/21/20 0934  BP: (!) 121/58 (!) 110/48  Pulse: 72 64  Resp: 12   Temp:    SpO2: 100% 100%    Last Pain:  Vitals:   03/21/20 0948  TempSrc:   PainSc: 3                  Megan Dalton

## 2020-03-24 ENCOUNTER — Encounter (HOSPITAL_COMMUNITY): Payer: Self-pay | Admitting: Urology

## 2021-01-30 ENCOUNTER — Encounter: Payer: Self-pay | Admitting: Gastroenterology

## 2021-03-30 ENCOUNTER — Other Ambulatory Visit: Payer: Self-pay

## 2021-03-30 ENCOUNTER — Ambulatory Visit (AMBULATORY_SURGERY_CENTER): Payer: Self-pay

## 2021-03-30 VITALS — Ht 62.5 in | Wt 141.4 lb

## 2021-03-30 DIAGNOSIS — Z1211 Encounter for screening for malignant neoplasm of colon: Secondary | ICD-10-CM

## 2021-03-30 MED ORDER — NA SULFATE-K SULFATE-MG SULF 17.5-3.13-1.6 GM/177ML PO SOLN: 1.0000 | Freq: Once | ORAL | 0 refills | Status: AC

## 2021-03-30 NOTE — Progress Notes (Signed)
Denies allergies to eggs or soy products. Denies complication of anesthesia or sedation. Denies use of weight loss medication. Denies use of O2.   Emmi instructions given for colonoscopy.  

## 2021-04-07 ENCOUNTER — Encounter: Payer: Self-pay | Admitting: Gastroenterology

## 2021-04-13 ENCOUNTER — Ambulatory Visit (AMBULATORY_SURGERY_CENTER): Payer: 59 | Admitting: Gastroenterology

## 2021-04-13 ENCOUNTER — Other Ambulatory Visit: Payer: Self-pay

## 2021-04-13 ENCOUNTER — Encounter: Payer: Self-pay | Admitting: Gastroenterology

## 2021-04-13 VITALS — BP 104/64 | HR 72 | Temp 97.8°F | Resp 24 | Ht 62.5 in | Wt 141.4 lb

## 2021-04-13 DIAGNOSIS — Z1211 Encounter for screening for malignant neoplasm of colon: Secondary | ICD-10-CM

## 2021-04-13 DIAGNOSIS — D123 Benign neoplasm of transverse colon: Secondary | ICD-10-CM

## 2021-04-13 DIAGNOSIS — D122 Benign neoplasm of ascending colon: Secondary | ICD-10-CM

## 2021-04-13 MED ORDER — SODIUM CHLORIDE 0.9 % IV SOLN: 500.0000 mL | Freq: Once | INTRAVENOUS | Status: DC

## 2021-04-13 NOTE — Patient Instructions (Signed)
Handouts given on polyps, high fiber diet and hemorrhoids.  YOU HAD AN ENDOSCOPIC PROCEDURE TODAY AT [facility]:   Refer to the procedure report that was given to you for any specific questions about what was found during the examination.  If the procedure report does not answer your questions, please call your gastroenterologist to clarify.  If you requested that your care partner not be given the details of your procedure findings, then the procedure report has been included in a sealed envelope for you to review at your convenience later.  YOU SHOULD EXPECT: Some feelings of bloating in the abdomen. Passage of more gas than usual.  Walking can help get rid of the air that was put into your GI tract during the procedure and reduce the bloating. If you had a lower endoscopy (such as a colonoscopy or flexible sigmoidoscopy) you may notice spotting of blood in your stool or on the toilet paper. If you underwent a bowel prep for your procedure, you may not have a normal bowel movement for a few days.  Please Note:  You might notice some irritation and congestion in your nose or some drainage.  This is from the oxygen used during your procedure.  There is no need for concern and it should clear up in a day or so.  SYMPTOMS TO REPORT IMMEDIATELY:  Following lower endoscopy (colonoscopy or flexible sigmoidoscopy):  Excessive amounts of blood in the stool  Significant tenderness or worsening of abdominal pains  Swelling of the abdomen that is new, acute  Fever of 100F or higher   For urgent or emergent issues, a gastroenterologist can be reached at any hour by calling [phone]. Do not use MyChart messaging for urgent concerns.    DIET:  We do recommend a small meal at first, but then you may proceed to your regular diet.  Drink plenty of fluids but you should avoid alcoholic beverages for 24 hours.  ACTIVITY:  You should plan to take it easy for the rest of today and you  should NOT DRIVE or use heavy machinery until tomorrow (because of the sedation medicines used during the test).    FOLLOW UP: Our staff will call the number listed on your records 48-72 hours following your procedure to check on you and address any questions or concerns that you may have regarding the information given to you following your procedure. If we do not reach you, we will leave a message.  We will attempt to reach you two times.  During this call, we will ask if you have developed any symptoms of COVID 19. If you develop any symptoms (ie: fever, flu-like symptoms, shortness of breath, cough etc.) before then, please call [phone].  If you test positive for Covid 19 in the 2 weeks post procedure, please call and report this information to [place].    If any biopsies were taken you will be contacted by phone or by letter within the next 1-3 weeks.  Please call us at [phone] if you have not heard about the biopsies in 3 weeks.    SIGNATURES/CONFIDENTIALITY: You and/or your care partner have signed paperwork which will be entered into your electronic medical record.  These signatures attest to the fact that that the information above on your After Visit Summary has been reviewed and is understood.  Full responsibility of the confidentiality of this discharge information lies with you and/or your care-partner.

## 2021-04-13 NOTE — Progress Notes (Signed)
Called to room to assist during endoscopic procedure.  Patient ID and intended procedure confirmed with present staff. Received instructions for my participation in the procedure from the performing physician.  

## 2021-04-13 NOTE — Progress Notes (Signed)
[facility] History and Physical   Primary Care Physician:  Waldemar Dickens, MD   Reason for Procedure:   Colon cancer screening  Plan:    Screening colonoscopy    HPI: Megan Dalton is a 55 y.o. female undergoing initial average risk screening colonoscopy.  She has no family history of colon cancer and no chronic GI symptoms.   She is bothered by chronic hemorrhoids   Past Medical History:  Diagnosis Date   Complication of anesthesia    GERD (gastroesophageal reflux disease)    History of kidney stones    Kidney stones    Nephrolithiasis    since age 62.   Calcium oxalate.   PONV (postoperative nausea and vomiting)     Past Surgical History:  Procedure Laterality Date   CYSTOSCOPY WITH RETROGRADE PYELOGRAM, URETEROSCOPY AND STENT PLACEMENT Left 12/09/2015   Procedure: CYSTOSCOPY WITH LEFT RETROGRADE PYELOGRAM, LEFT URETEROSCOPY STONE MANIPULATION WITH STONE OBTAINED AND LEFT STENT PLACEMENT;  Surgeon: Alexis Frock, MD;  Location: [facility];  Service: Urology;  Laterality: Left;   CYSTOSCOPY WITH RETROGRADE PYELOGRAM, URETEROSCOPY AND STENT PLACEMENT Bilateral 03/21/2020   Procedure: CYSTOSCOPY WITH BILATERAL RETROGRADE PYELOGRAM URETEROSCOPY ,BASKET STONE EXTRACTION AND BILATERAL STENT PLACEMENT;  Surgeon: Alexis Frock, MD;  Location: [facility];  Service: Urology;  Laterality: Bilateral;  [address]     LITHOTRIPSY  2008   approx   URETEROSCOPY  1997    Prior to Admission medications   Medication Sig Start Date End Date Taking? Authorizing Provider  albuterol (VENTOLIN HFA) 108 (90 Base) MCG/ACT inhaler Inhale into the lungs every 6 (six) hours as needed for wheezing or shortness of breath.   Yes [provider]  Melatonin 1 MG CAPS Take 2 mg by mouth at bedtime.   Yes [provider]  ketorolac (TORADOL) 10 MG tablet Take 1 tablet (10 mg total) by mouth every 8 (eight) hours as needed for moderate pain. Or stent  discomfort post-operatively. Patient not taking: Reported on 03/30/2021 03/21/20   Alexis Frock, MD    Current Outpatient Medications  Medication Sig Dispense Refill   albuterol (VENTOLIN HFA) 108 (90 Base) MCG/ACT inhaler Inhale into the lungs every 6 (six) hours as needed for wheezing or shortness of breath.     Melatonin 1 MG CAPS Take 2 mg by mouth at bedtime.     ketorolac (TORADOL) 10 MG tablet Take 1 tablet (10 mg total) by mouth every 8 (eight) hours as needed for moderate pain. Or stent discomfort post-operatively. (Patient not taking: Reported on 03/30/2021) 20 tablet 0   Current Facility-Administered Medications  Medication Dose Route Frequency Provider Last Rate Last Admin   0.9 %  sodium chloride infusion  500 mL Intravenous Once Daryel November, MD        Allergies as of 04/13/2021 - Review Complete 04/13/2021  Allergen Reaction Noted   Hydrocodone Nausea And Vomiting 12/08/2015   Morphine and related Nausea And Vomiting 12/08/2015    Family History  Problem Relation Age of Onset   Colon cancer Neg Hx    Esophageal cancer Neg Hx    Rectal cancer Neg Hx    Stomach cancer Neg Hx     Social History   Socioeconomic History   Marital status: Married    Spouse name: Not on file   Number of children: Not on file   Years of education: Not on file   Highest education level: Not on file  Occupational History   Not on file  Tobacco Use   Smoking status: Never   Smokeless tobacco: Never  Vaping Use   Vaping Use: Never used  Substance and Sexual Activity   Alcohol use: No   Drug use: No   Sexual activity: Not on file  Other Topics Concern   Not on file  Social History Narrative   Not on file   Social Determinants of Health   Financial Resource Strain: Not on file  Food Insecurity: Not on file  Transportation Needs: Not on file  Physical Activity: Not on file  Stress: Not on file  Social Connections: Not on file  Intimate Partner Violence: Not on  file    Review of Systems:  All other review of systems negative except as mentioned in the HPI.  Physical Exam: Vital signs BP 126/79   Pulse 74   Temp 97.8 F (36.6 C) (Temporal)   Ht 5' 2.5" (1.588 m)   Wt 141 lb 6.4 oz (64.1 kg)   SpO2 99%   BMI 25.45 kg/m   General:   Alert,  Well-developed, well-nourished, pleasant and cooperative in NAD Airway:  Mallampati 2 Lungs:  Clear throughout to auscultation.   Heart:  Regular rate and rhythm; no murmurs, clicks, rubs,  or gallops. Abdomen:  Soft, nontender and nondistended. Normal bowel sounds.   Neuro/Psych:  Normal mood and affect. A and O x 3   Avarae Zwart E. Candis Schatz, MD [facility]

## 2021-04-13 NOTE — Progress Notes (Signed)
PT taken to PACU. Monitors in place. VSS. Report given to RN. 

## 2021-04-13 NOTE — Progress Notes (Signed)
VS completed by CW.   Pt's states no medical or surgical changes since previsit or office visit.  

## 2021-04-13 NOTE — Op Note (Signed)
[facility] Patient Name: Megan Dalton Procedure Date: 04/13/2021 7:58 AM MRN: [id number] Endoscopist: [place]. Candis Schatz , MD Age: 55 Referring MD:  Date of Birth: 1966-01-01 Gender: Female Account #: [id number] Procedure:                Colonoscopy Indications:              Screening for colorectal malignant neoplasm, This                            is the patient's first colonoscopy Medicines:                Monitored Anesthesia Care Procedure:                Pre-Anesthesia Assessment:                           - Prior to the procedure, a History and Physical                            was performed, and patient medications and                            allergies were reviewed. The patient's tolerance of                            previous anesthesia was also reviewed. The risks                            and benefits of the procedure and the sedation                            options and risks were discussed with the patient.                            All questions were answered, and informed consent                            was obtained. Prior Anticoagulants: The patient has                            taken no previous anticoagulant or antiplatelet                            agents. ASA Grade Assessment: II - A patient with                            mild systemic disease. After reviewing the risks                            and benefits, the patient was deemed in                            satisfactory condition to undergo the procedure.  After obtaining informed consent, the colonoscope                            was passed under direct vision. Throughout the                            procedure, the patient's blood pressure, pulse, and                            oxygen saturations were monitored continuously. The                            PCF-HQ190L Colonoscope was introduced through the                            anus and advanced to  the the terminal ileum, with                            identification of the appendiceal orifice and IC                            valve. The colonoscopy was performed without                            difficulty. The patient tolerated the procedure                            well. The quality of the bowel preparation was                            excellent. The terminal ileum, ileocecal valve,                            appendiceal orifice, and rectum were photographed.                            The bowel preparation used was SUPREP via split                            dose instruction. Scope In: 8:06:09 AM Scope Out: 8:23:32 AM Scope Withdrawal Time: 0 hours 10 minutes 28 seconds  Total Procedure Duration: 0 hours 17 minutes 23 seconds  Findings:                 The perianal and digital rectal examinations were                            normal. Pertinent negatives include normal                            sphincter tone and no palpable rectal lesions.                           A 6 mm polyp was found in the transverse colon. The  polyp was sessile. The polyp was removed with a                            cold snare. Resection and retrieval were complete.                            Estimated blood loss was minimal.                           A 4 mm polyp was found in the ascending colon. The                            polyp was sessile. The polyp was removed with a                            jumbo cold forceps. Resection and retrieval were                            complete. Estimated blood loss was minimal.                           The exam was otherwise normal throughout the                            examined colon.                           The terminal ileum appeared normal.                           Non-bleeding internal hemorrhoids were found during                            retroflexion.                           No additional abnormalities were  found on                            retroflexion. Complications:            No immediate complications. Estimated Blood Loss:     Estimated blood loss was minimal. Impression:               - One 6 mm polyp in the transverse colon, removed                            with a cold snare. Resected and retrieved.                           - One 4 mm polyp in the ascending colon, removed                            with a jumbo cold forceps. Resected and retrieved.                           -  The examined portion of the ileum was normal.                           - Non-bleeding internal hemorrhoids. Recommendation:           - Patient has a contact number available for                            emergencies. The signs and symptoms of potential                            delayed complications were discussed with the                            patient. Return to normal activities tomorrow.                            Written discharge instructions were provided to the                            patient.                           - Resume previous diet.                           - Continue present medications.                           - Await pathology results.                           - Repeat colonoscopy in 7 years for surveillance.                           - Consider daily fiber supplementation to reduce                            hemorrhoid symptoms. Banding can also be performed,                            if desired. Jamel Holzmann E. Candis Schatz, MD 04/13/2021 8:43:06 AM This report has been signed electronically.

## 2021-04-15 ENCOUNTER — Telehealth: Payer: Self-pay | Admitting: *Deleted

## 2021-04-15 NOTE — Telephone Encounter (Signed)
Message left

## 2021-04-15 NOTE — Telephone Encounter (Signed)
°  Follow up Call-  Call back number 04/13/2021  Post procedure Call Back phone  # [phone]  Permission to leave phone message Yes  Some recent data might be hidden     Patient questions: Message left to call us if necessary.

## 2021-04-22 NOTE — Progress Notes (Signed)
Megan Dalton,  The two polyps which I removed during your recent procedure were proven to be completely benign but are considered "pre-cancerous" polyps that MAY have grown into cancer if they had not been removed.  Studies shows that at least 20% of women over age 55 and 30% of men over age 30 have pre-cancerous polyps.  Based on current nationally recognized surveillance guidelines, I recommend that you have a repeat colonoscopy in 7 years.   If you develop any new rectal bleeding, abdominal pain or significant bowel habit changes, please contact me before then.

## 2023-10-18 ENCOUNTER — Other Ambulatory Visit: Payer: Self-pay | Admitting: Urology

## 2023-10-18 NOTE — Progress Notes (Signed)
 Sent message, via epic in basket, requesting orders in epic from Careers adviser.

## 2023-10-21 NOTE — Progress Notes (Signed)
 Second request for pre op orders: spoke with [facility].

## 2023-10-21 NOTE — Progress Notes (Signed)
 COVID Vaccine Completed:  Date of COVID positive in last 90 days:  PCP - Maryella Smothers, MD Cardiologist -   Chest x-ray - 09/22/23 [facility] EKG -  Stress Test -  ECHO -  Cardiac Cath -  Pacemaker/ICD device last checked: Spinal Cord Stimulator:  Bowel Prep -   Sleep Study -  CPAP -   Fasting Blood Sugar -  Checks Blood Sugar _____ times a day  Last dose of GLP1 agonist-  N/A GLP1 instructions:  Do not take after     Last dose of SGLT-2 inhibitors-  N/A SGLT-2 instructions:  Do not take after     Blood Thinner Instructions:  Last dose:   Time: Aspirin Instructions: Last Dose:  Activity level:  Can go up a flight of stairs and perform activities of daily living without stopping and without symptoms of chest pain or shortness of breath.  Able to exercise without symptoms  Unable to go up a flight of stairs without symptoms of     Anesthesia review:   Patient denies shortness of breath, fever, cough and chest pain at PAT appointment  Patient verbalized understanding of instructions that were given to them at the PAT appointment. Patient was also instructed that they will need to review over the PAT instructions again at home before surgery.

## 2023-10-21 NOTE — Patient Instructions (Signed)
 SURGICAL WAITING ROOM VISITATION  Patients having surgery or a procedure may have no more than 2 support people in the waiting area - these visitors may rotate.    Children under the age of 47 must have an adult with them who is not the patient.  Visitors with respiratory illnesses are discouraged from visiting and should remain at home.  If the patient needs to stay at the hospital during part of their recovery, the visitor guidelines for inpatient rooms apply. Pre-op nurse will coordinate an appropriate time for 1 support person to accompany patient in pre-op.  This support person may not rotate.    Please refer to the [facility] website for the visitor guidelines for Inpatients (after your surgery is over and you are in a regular room).    Your procedure is scheduled on: 11/01/23   Report to [facility] Main Entrance    Report to admitting at 8:45 AM   Call this number if you have problems the morning of surgery [phone]   Do not eat food or drink liquids :After Midnight.         If you have questions, please contact your surgeon's office.   FOLLOW BOWEL PREP AND ANY ADDITIONAL PRE OP INSTRUCTIONS YOU RECEIVED FROM YOUR SURGEON'S OFFICE!!!     Oral Hygiene is also important to reduce your risk of infection.                                    Remember - BRUSH YOUR TEETH THE MORNING OF SURGERY WITH YOUR REGULAR TOOTHPASTE  DENTURES WILL BE REMOVED PRIOR TO SURGERY PLEASE DO NOT APPLY Poly grip OR ADHESIVES!!!   Stop all vitamins and herbal supplements 7 days before surgery.   Take these medicines the morning of surgery with A SIP OF WATER : Inhaler, Bupropion , Buspirone , Zofran , Synthroid                                You may not have any metal on your body including hair pins, jewelry, and body piercing             Do not wear make-up, lotions, powders, perfumes, or deodorant  Do not wear nail polish including gel and S&S, artificial/acrylic nails, or any other  type of covering on natural nails including finger and toenails. If you have artificial nails, gel coating, etc. that needs to be removed by a nail salon please have this removed prior to surgery or surgery may need to be canceled/ delayed if the surgeon/ anesthesia feels like they are unable to be safely monitored.   Do not shave  48 hours prior to surgery.    Do not bring valuables to the hospital. [facility] IS NOT             RESPONSIBLE   FOR VALUABLES.   Contacts, glasses, dentures or bridgework may not be worn into surgery.  DO NOT BRING YOUR HOME MEDICATIONS TO THE HOSPITAL. PHARMACY WILL DISPENSE MEDICATIONS LISTED ON YOUR MEDICATION LIST TO YOU DURING YOUR ADMISSION IN THE HOSPITAL!    Patients discharged on the day of surgery will not be allowed to drive home.  Someone NEEDS to stay with you for the first 24 hours after anesthesia.              Please read over the following fact  sheets you were given: IF YOU HAVE QUESTIONS ABOUT YOUR PRE-OP INSTRUCTIONS PLEASE CALL [phone]   If you received a COVID test during your pre-op visit  it is requested that you wear a mask when out in public, stay away from anyone that may not be feeling well and notify your surgeon if you develop symptoms. If you test positive for Covid or have been in contact with anyone that has tested positive in the last 10 days please notify you surgeon.    [facility] - Preparing for Surgery Before surgery, you can play an important role.  Because skin is not sterile, your skin needs to be as free of germs as possible.  You can reduce the number of germs on your skin by washing with CHG (chlorahexidine gluconate) soap before surgery.  CHG is an antiseptic cleaner which kills germs and bonds with the skin to continue killing germs even after washing. Please DO NOT use if you have an allergy  to CHG or antibacterial soaps.  If your skin becomes reddened/irritated stop using the CHG and inform your nurse  when you arrive at Short Stay. Do not shave (including legs and underarms) for at least 48 hours prior to the first CHG shower.  You may shave your face/neck.  Please follow these instructions carefully:  1.  Shower with CHG Soap the night before surgery and the  morning of surgery.  2.  If you choose to wash your hair, wash your hair first as usual with your normal  shampoo.  3.  After you shampoo, rinse your hair and body thoroughly to remove the shampoo.                             4.  Use CHG as you would any other liquid soap.  You can apply chg directly to the skin and wash.  Gently with a scrungie or clean washcloth.  5.  Apply the CHG Soap to your body ONLY FROM THE NECK DOWN.   Do   not use on face/ open                           Wound or open sores. Avoid contact with eyes, ears mouth and   genitals (private parts).                       Wash face,  Genitals (private parts) with your normal soap.             6.  Wash thoroughly, paying special attention to the area where your    surgery  will be performed.  7.  Thoroughly rinse your body with warm water  from the neck down.  8.  DO NOT shower/wash with your normal soap after using and rinsing off the CHG Soap.                9.  Pat yourself dry with a clean towel.            10.  Wear clean pajamas.            11.  Place clean sheets on your bed the night of your first shower and do not  sleep with pets. Day of Surgery : Do not apply any lotions/deodorants the morning of surgery.  Please wear clean clothes to the hospital/surgery center.  FAILURE TO FOLLOW THESE INSTRUCTIONS  MAY RESULT IN THE CANCELLATION OF YOUR SURGERY  PATIENT SIGNATURE_________________________________  NURSE SIGNATURE__________________________________  ________________________________________________________________________

## 2023-10-25 ENCOUNTER — Encounter (HOSPITAL_COMMUNITY)
Admission: RE | Admit: 2023-10-25 | Discharge: 2023-10-25 | Disposition: A | Discharge status: Home / Self Care | Source: Ambulatory Visit | Attending: Urology | Admitting: Urology

## 2023-10-25 ENCOUNTER — Other Ambulatory Visit: Payer: Self-pay

## 2023-10-25 ENCOUNTER — Encounter (HOSPITAL_COMMUNITY): Payer: Self-pay

## 2023-10-25 VITALS — BP 100/52 | HR 81 | Temp 98.1°F | Resp 16 | Ht 62.0 in | Wt 148.0 lb

## 2023-10-25 DIAGNOSIS — Z01812 Encounter for preprocedural laboratory examination: Secondary | ICD-10-CM | POA: Insufficient documentation

## 2023-10-25 DIAGNOSIS — Z01818 Encounter for other preprocedural examination: Secondary | ICD-10-CM

## 2023-10-25 HISTORY — DX: Pneumonia, unspecified organism: J18.9

## 2023-10-25 HISTORY — DX: Sepsis, unspecified organism: A41.9

## 2023-10-25 LAB — BASIC METABOLIC PANEL WITH GFR
Anion gap: 9 (ref 5–15)
BUN: 18 mg/dL (ref 6–20)
CO2: 26 mmol/L (ref 22–32)
Calcium: 8.9 mg/dL (ref 8.9–10.3)
Chloride: 105 mmol/L (ref 98–111)
Creatinine, Ser: 0.7 mg/dL (ref 0.44–1.00)
GFR, Estimated: >60 mL/min (ref >60)
Glucose, Bld: 151 mg/dL — ABNORMAL HIGH (ref 70–99)
Potassium: 3.5 mmol/L (ref 3.5–5.1)
Sodium: 140 mmol/L (ref 135–145)

## 2023-10-25 LAB — CBC
HCT: 34.6 % — ABNORMAL LOW (ref 36.0–46.0)
Hemoglobin: 11.5 g/dL — ABNORMAL LOW (ref 12.0–15.0)
MCH: 30.5 pg (ref 26.0–34.0)
MCHC: 33.2 g/dL (ref 30.0–36.0)
MCV: 91.8 fL (ref 80.0–100.0)
Platelets: 325 10*3/uL (ref 150–400)
RBC: 3.77 MIL/uL — ABNORMAL LOW (ref 3.87–5.11)
RDW: 12.6 % (ref 11.5–15.5)
WBC: 7.9 10*3/uL (ref 4.0–10.5)
nRBC: 0 % (ref 0.0–0.2)

## 2023-10-26 ENCOUNTER — Ambulatory Visit (HOSPITAL_COMMUNITY): Admitting: Anesthesiology

## 2023-10-26 ENCOUNTER — Ambulatory Visit (HOSPITAL_COMMUNITY)

## 2023-10-26 ENCOUNTER — Ambulatory Visit (HOSPITAL_COMMUNITY)
Admission: RE | Admit: 2023-10-26 | Discharge: 2023-10-26 | Disposition: A | Discharge status: Home / Self Care | Attending: Urology | Admitting: Urology

## 2023-10-26 ENCOUNTER — Encounter (HOSPITAL_COMMUNITY): Payer: Self-pay | Admitting: Urology

## 2023-10-26 ENCOUNTER — Ambulatory Visit (HOSPITAL_COMMUNITY): Payer: Self-pay | Admitting: Physician Assistant

## 2023-10-26 ENCOUNTER — Other Ambulatory Visit: Payer: Self-pay

## 2023-10-26 ENCOUNTER — Encounter (HOSPITAL_COMMUNITY)
Admission: RE | Disposition: A | Discharge status: Home / Self Care | Payer: Self-pay | Source: Home / Self Care | Attending: Urology

## 2023-10-26 DIAGNOSIS — N202 Calculus of kidney with calculus of ureter: Secondary | ICD-10-CM | POA: Diagnosis present

## 2023-10-26 DIAGNOSIS — K219 Gastro-esophageal reflux disease without esophagitis: Secondary | ICD-10-CM | POA: Insufficient documentation

## 2023-10-26 DIAGNOSIS — R310 Gross hematuria: Secondary | ICD-10-CM | POA: Diagnosis not present

## 2023-10-26 DIAGNOSIS — Z87442 Personal history of urinary calculi: Secondary | ICD-10-CM | POA: Insufficient documentation

## 2023-10-26 DIAGNOSIS — R82994 Hypercalciuria: Secondary | ICD-10-CM | POA: Diagnosis not present

## 2023-10-26 DIAGNOSIS — R34 Anuria and oliguria: Secondary | ICD-10-CM | POA: Insufficient documentation

## 2023-10-26 HISTORY — PX: CYSTOSCOPY W/ RETROGRADES: SHX1426

## 2023-10-26 HISTORY — PX: CYSTOSCOPY/URETEROSCOPY/HOLMIUM LASER/STENT PLACEMENT: SHX6546

## 2023-10-26 SURGERY — CYSTOSCOPY/URETEROSCOPY/HOLMIUM LASER/STENT PLACEMENT
Anesthesia: General | Laterality: Bilateral

## 2023-10-26 MED ORDER — FENTANYL CITRATE PF 50 MCG/ML IJ SOSY
PREFILLED_SYRINGE | INTRAMUSCULAR | Status: AC
  Filled 2023-10-26: qty 1

## 2023-10-26 MED ORDER — CEPHALEXIN 500 MG PO CAPS: 500.0000 mg | ORAL_CAPSULE | Freq: Two times a day (BID) | ORAL | 0 refills | Status: AC

## 2023-10-26 MED ORDER — EPHEDRINE SULFATE-NACL 50-0.9 MG/10ML-% IV SOSY
PREFILLED_SYRINGE | INTRAVENOUS | Status: DC | PRN
  Administered 2023-10-26: 10 mg via INTRAVENOUS
  Administered 2023-10-26: 5 mg via INTRAVENOUS

## 2023-10-26 MED ORDER — LACTATED RINGERS IV SOLN: INTRAVENOUS | Status: DC

## 2023-10-26 MED ORDER — AMISULPRIDE (ANTIEMETIC) 5 MG/2ML IV SOLN
INTRAVENOUS | Status: AC
  Filled 2023-10-26: qty 4

## 2023-10-26 MED ORDER — OXYBUTYNIN CHLORIDE 5 MG PO TABS
ORAL_TABLET | ORAL | Status: AC
  Filled 2023-10-26: qty 1

## 2023-10-26 MED ORDER — PROPOFOL 10 MG/ML IV BOLUS
INTRAVENOUS | Status: AC
  Filled 2023-10-26: qty 20

## 2023-10-26 MED ORDER — LIDOCAINE HCL (CARDIAC) PF 100 MG/5ML IV SOSY
PREFILLED_SYRINGE | INTRAVENOUS | Status: DC | PRN
  Administered 2023-10-26: 80 mg via INTRAVENOUS

## 2023-10-26 MED ORDER — DEXAMETHASONE SODIUM PHOSPHATE 10 MG/ML IJ SOLN
INTRAMUSCULAR | Status: DC | PRN
  Administered 2023-10-26: 10 mg via INTRAVENOUS

## 2023-10-26 MED ORDER — SENNOSIDES-DOCUSATE SODIUM 8.6-50 MG PO TABS
1.0000 | ORAL_TABLET | Freq: Two times a day (BID) | ORAL | 0 refills | Status: AC
Start: 2023-10-26 — End: ?

## 2023-10-26 MED ORDER — GENTAMICIN SULFATE 40 MG/ML IJ SOLN
5.0000 mg/kg | INTRAVENOUS | Status: AC
  Administered 2023-10-26: 340 mg via INTRAVENOUS
  Filled 2023-10-26: qty 8.5

## 2023-10-26 MED ORDER — CHLORHEXIDINE GLUCONATE 0.12 % MT SOLN
15.0000 mL | Freq: Once | OROMUCOSAL | Status: AC
  Administered 2023-10-26: 15 mL via OROMUCOSAL

## 2023-10-26 MED ORDER — PROPOFOL 10 MG/ML IV BOLUS
INTRAVENOUS | Status: DC | PRN
  Administered 2023-10-26: 170 mg via INTRAVENOUS

## 2023-10-26 MED ORDER — AMISULPRIDE (ANTIEMETIC) 5 MG/2ML IV SOLN
10.0000 mg | Freq: Once | INTRAVENOUS | Status: AC | PRN
  Administered 2023-10-26: 10 mg via INTRAVENOUS

## 2023-10-26 MED ORDER — KETOROLAC TROMETHAMINE 10 MG PO TABS: 10.0000 mg | ORAL_TABLET | Freq: Three times a day (TID) | ORAL | 0 refills | Status: AC | PRN

## 2023-10-26 MED ORDER — LIDOCAINE HCL (PF) 2 % IJ SOLN
INTRAMUSCULAR | Status: AC
  Filled 2023-10-26: qty 5

## 2023-10-26 MED ORDER — FENTANYL CITRATE (PF) 100 MCG/2ML IJ SOLN
INTRAMUSCULAR | Status: AC
  Filled 2023-10-26: qty 2

## 2023-10-26 MED ORDER — ACETAMINOPHEN 500 MG PO TABS
ORAL_TABLET | ORAL | Status: AC
  Filled 2023-10-26: qty 2

## 2023-10-26 MED ORDER — SODIUM CHLORIDE 0.9 % IR SOLN
Status: DC | PRN
  Administered 2023-10-26: 6000 mL via INTRAVESICAL

## 2023-10-26 MED ORDER — KETOROLAC TROMETHAMINE 30 MG/ML IJ SOLN
INTRAMUSCULAR | Status: DC | PRN
  Administered 2023-10-26: 30 mg via INTRAVENOUS

## 2023-10-26 MED ORDER — FENTANYL CITRATE (PF) 100 MCG/2ML IJ SOLN
INTRAMUSCULAR | Status: DC | PRN
  Administered 2023-10-26: 25 ug via INTRAVENOUS
  Administered 2023-10-26: 50 ug via INTRAVENOUS
  Administered 2023-10-26 (×3): 25 ug via INTRAVENOUS
  Administered 2023-10-26: 50 ug via INTRAVENOUS

## 2023-10-26 MED ORDER — ONDANSETRON HCL 4 MG/2ML IJ SOLN
INTRAMUSCULAR | Status: DC | PRN
  Administered 2023-10-26: 4 mg via INTRAVENOUS

## 2023-10-26 MED ORDER — ACETAMINOPHEN 500 MG PO TABS
1000.0000 mg | ORAL_TABLET | Freq: Once | ORAL | Status: AC
  Administered 2023-10-26: 1000 mg via ORAL

## 2023-10-26 MED ORDER — MIDAZOLAM HCL 2 MG/2ML IJ SOLN
INTRAMUSCULAR | Status: DC | PRN
  Administered 2023-10-26: 2 mg via INTRAVENOUS

## 2023-10-26 MED ORDER — MIDAZOLAM HCL 2 MG/2ML IJ SOLN
INTRAMUSCULAR | Status: AC
  Filled 2023-10-26: qty 2

## 2023-10-26 MED ORDER — DEXAMETHASONE SODIUM PHOSPHATE 10 MG/ML IJ SOLN
INTRAMUSCULAR | Status: AC
  Filled 2023-10-26: qty 1

## 2023-10-26 MED ORDER — IOHEXOL 300 MG/ML  SOLN
INTRAMUSCULAR | Status: DC | PRN
  Administered 2023-10-26: 20 mL via URETHRAL

## 2023-10-26 MED ORDER — OXYBUTYNIN CHLORIDE 5 MG PO TABS
10.0000 mg | ORAL_TABLET | Freq: Once | ORAL | Status: AC
  Administered 2023-10-26: 10 mg via ORAL

## 2023-10-26 MED ORDER — ONDANSETRON HCL 4 MG/2ML IJ SOLN
INTRAMUSCULAR | Status: AC
Start: 2023-10-26 — End: 2023-10-26
  Filled 2023-10-26: qty 2

## 2023-10-26 MED ORDER — TRAMADOL HCL 50 MG PO TABS: 50.0000 mg | ORAL_TABLET | Freq: Four times a day (QID) | ORAL | 0 refills | Status: AC | PRN

## 2023-10-26 MED ORDER — ORAL CARE MOUTH RINSE: 15.0000 mL | Freq: Once | OROMUCOSAL | Status: AC

## 2023-10-26 MED ORDER — FENTANYL CITRATE PF 50 MCG/ML IJ SOSY
25.0000 ug | PREFILLED_SYRINGE | INTRAMUSCULAR | Status: DC | PRN
  Administered 2023-10-26 (×2): 50 ug via INTRAVENOUS

## 2023-10-26 SURGICAL SUPPLY — 19 items
BAG URO CATCHER STRL LF (MISCELLANEOUS) ×1 IMPLANT
BASKET LASER NITINOL 1.9FR (BASKET) IMPLANT
CATH URETL OPEN END 6FR 70 (CATHETERS) ×1 IMPLANT
CLOTH BEACON ORANGE TIMEOUT ST (SAFETY) ×1 IMPLANT
GLOVE SURG LX STRL 7.5 STRW (GLOVE) ×1 IMPLANT
GOWN STRL REUS W/ TWL XL LVL3 (GOWN DISPOSABLE) ×1 IMPLANT
GUIDEWIRE ANG ZIPWIRE 038X150 (WIRE) ×1 IMPLANT
GUIDEWIRE STR DUAL SENSOR (WIRE) ×1 IMPLANT
KIT TURNOVER KIT A (KITS) ×1 IMPLANT
MANIFOLD NEPTUNE II (INSTRUMENTS) ×1 IMPLANT
NS IRRIG 1000ML POUR BTL (IV SOLUTION) IMPLANT
PACK CYSTO (CUSTOM PROCEDURE TRAY) ×1 IMPLANT
SHEATH NAVIGATOR HD 11/13X28 (SHEATH) IMPLANT
SHEATH NAVIGATOR HD 11/13X36 (SHEATH) IMPLANT
STENT POLARIS 5FRX22 (STENTS) IMPLANT
TRACTIP FLEXIVA PULS ID 200XHI (Laser) IMPLANT
TUBE PU 8FR 16IN ENFIT (TUBING) ×1 IMPLANT
TUBING CONNECTING 10 (TUBING) ×1 IMPLANT
TUBING UROLOGY SET (TUBING) ×1 IMPLANT

## 2023-10-26 NOTE — Brief Op Note (Signed)
 10/26/2023  12:12 PM  PATIENT:  Shasta Moats  58 y.o. female  PRE-OPERATIVE DIAGNOSIS:  BIL URETERAL AND RENAL STONES  POST-OPERATIVE DIAGNOSIS:  BIL URETERAL AND RENAL STONES  PROCEDURE:  Procedure(s) with comments: CYSTOSCOPY/URETEROSCOPY/HOLMIUM LASER/STENT PLACEMENT (Bilateral) - CYSTOSCOPY, BILATERAL RETROGRADE PYELOGRAM, URETEROSCOPY, HOLMIUM LASER, STENT CYSTOSCOPY, WITH RETROGRADE PYELOGRAM (Bilateral)  SURGEON:  Surgeons and Role:    * Manny, Ricardo KATHEE Raddle., MD - Primary  PHYSICIAN ASSISTANT:   ASSISTANTS: none   ANESTHESIA:   general  EBL:  minimal   BLOOD ADMINISTERED:none  DRAINS: none   LOCAL MEDICATIONS USED:  NONE  SPECIMEN:  Source of Specimen:  left ureteral stone  DISPOSITION OF SPECIMEN:  given to patient  COUNTS:  YES  TOURNIQUET:  * No tourniquets in log *  DICTATION: .Other Dictation: Dictation Number [id number]  PLAN OF CARE: Discharge to home after PACU  PATIENT DISPOSITION:  PACU - hemodynamically stable.   Delay start of Pharmacological VTE agent (>24hrs) due to surgical blood loss or risk of bleeding: yes

## 2023-10-26 NOTE — H&P (Signed)
 Megan Dalton is an 58 y.o. female.    Chief Complaint: Pre-OP BILATERAL Ureteroscopic Stone Manipulationd  HPI:   1 - Recurrent Nephrolithiasis -  Pre 2017 - URS x 1, MET x many  12/2015- left URS / stent to stone free for about 6mm total voluem each side  10/2023 -left JJ stent placed in [place] for 3mm UVJ stone and bilateral punctate renal sotnes, final UCX no growth, given rocephin.   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   3 - Gross Hematuria - visible blood in urine x several with runnin 2021. Prior h/o stones. Non-smoker. No chronic solvent exposure. UA without infectious parameters. Hematuria CT 02/2020 with small bilateral renal stones only (about 6mm total each side, no hydro).   PMH unremarkable. Used to work at [employer], now with [employer] in [place] and commutes (husband still at [employer]). Her PCP is Alm Heads MD with [employer].   Today Megan Dalton is seen to proceed with BILATERAL ureteroscopy with goal of stone free. MOst recent UCX negative and UA without infectious parameters.   Past Medical History:  Diagnosis Date   Complication of anesthesia    GERD (gastroesophageal reflux disease)    History of kidney stones    Kidney stones    Nephrolithiasis    since age 80.   Calcium oxalate.   Pneumonia    PONV (postoperative nausea and vomiting)    Sepsis (HCC)     Past Surgical History:  Procedure Laterality Date   CYSTOSCOPY WITH RETROGRADE PYELOGRAM, URETEROSCOPY AND STENT PLACEMENT Left 12/09/2015   Procedure: CYSTOSCOPY WITH LEFT RETROGRADE PYELOGRAM, LEFT URETEROSCOPY STONE MANIPULATION WITH STONE OBTAINED AND LEFT STENT PLACEMENT;  Surgeon: Ricardo Likens, MD;  Location: [facility];  Service: Urology;  Laterality: Left;   CYSTOSCOPY WITH  RETROGRADE PYELOGRAM, URETEROSCOPY AND STENT PLACEMENT Bilateral 03/21/2020   Procedure: CYSTOSCOPY WITH BILATERAL RETROGRADE PYELOGRAM URETEROSCOPY ,BASKET STONE EXTRACTION AND BILATERAL STENT PLACEMENT;  Surgeon: Likens Ricardo, MD;  Location: [facility];  Service: Urology;  Laterality: Bilateral;  75 MINS   DILATION AND CURETTAGE OF UTERUS     LITHOTRIPSY  2008   approx   URETEROSCOPY  1997    Family History  Problem Relation Age of Onset   Colon cancer Neg Hx    Esophageal cancer Neg Hx    Rectal cancer Neg Hx    Stomach cancer Neg Hx    Social History:  reports that she has never smoked. She has never used smokeless tobacco. She reports that she does not drink alcohol and does not use drugs.  Allergies:  Allergies  Allergen Reactions   Hydrocodone Itching and Nausea And Vomiting   Morphine And Codeine Nausea And Vomiting    Can take with anti nausea medication    No medications prior to admission.    Results for orders placed or performed during the hospital encounter of 10/25/23 (from the past 48 hours)  CBC per protocol     Status: Abnormal   Collection Time: 10/25/23  1:41 PM  Result Value Ref Range   WBC 7.9 4.0 - 10.5 K/uL   RBC 3.77 (L) 3.87 - 5.11 MIL/uL   Hemoglobin 11.5 (L) 12.0 - 15.0 g/dL   HCT 65.3 (L) 63.9 - 53.9 %  MCV 91.8 80.0 - 100.0 fL   MCH 30.5 26.0 - 34.0 pg   MCHC 33.2 30.0 - 36.0 g/dL   RDW 87.3 88.4 - 84.4 %   Platelets 325 150 - 400 K/uL   nRBC 0.0 0.0 - 0.2 %    Comment: Performed at [facility], [address] [place], [place] [address]  Basic metabolic panel     Status: Abnormal   Collection Time: 10/25/23  1:41 PM  Result Value Ref Range   Sodium 140 135 - 145 mmol/L   Potassium 3.5 3.5 - 5.1 mmol/L   Chloride 105 98 - 111 mmol/L   CO2 26 22 - 32 mmol/L   Glucose, Bld 151 (H) 70 - 99 mg/dL    Comment: Glucose reference range applies only to samples taken after fasting for at least 8 hours.   BUN 18 6 - 20 mg/dL    Creatinine, Ser 9.29 0.44 - 1.00 mg/dL   Calcium 8.9 8.9 - 89.6 mg/dL   GFR, Estimated >39 >39 mL/min    Comment: (NOTE) Calculated using the CKD-EPI Creatinine Equation (2021)    Anion gap 9 5 - 15    Comment: Performed at [facility], [address], [place], [place] [address]   No results found.  Review of Systems  Constitutional:  Negative for chills and fever.  Genitourinary:  Positive for hematuria.  All other systems reviewed and are negative.   There were no vitals taken for this visit. Physical Exam Vitals reviewed.  HENT:     Head: Normocephalic.     Nose: Nose normal.   Eyes:     Pupils: Pupils are equal, round, and reactive to light.    Cardiovascular:     Rate and Rhythm: Normal rate.  Pulmonary:     Effort: Pulmonary effort is normal.  Abdominal:     General: Abdomen is flat.  Genitourinary:    Comments: No CVAT at present  Musculoskeletal:        General: Normal range of motion.     Cervical back: Normal range of motion.   Skin:    General: Skin is warm.   Neurological:     General: No focal deficit present.     Mental Status: She is alert.   Psychiatric:        Mood and Affect: Mood normal.      Assessment/Plan  Proceed as planned with BILATERAL ureteroscopioc stone manipulation. Risks, benefits, alternatives, expected peri-op course discussed previously and reiterated today. She understands that temporary post-op stents will be necessary given bilateral nature of procedure.   Ricardo KATHEE Alvaro Mickey., MD 10/26/2023, 6:34 AM

## 2023-10-26 NOTE — Discharge Instructions (Addendum)
 1 - You may have urinary urgency (bladder spasms) and bloody urine on / off with stent in place. This is normal.  2 - Remove tethered stents on Friday morning at home by pulling on strings, then blue-white plastic tubing, and discarding. There are TWO stents tied to common string.  Office is open Friday if any issues arise .  3 - Call MD or go to ER for fever >102, severe pain / nausea / vomiting not relieved by medications, or acute change in medical status

## 2023-10-26 NOTE — Transfer of Care (Signed)
 Immediate Anesthesia Transfer of Care Note  Patient: Megan Dalton  Procedure(s) Performed: CYSTOSCOPY/URETEROSCOPY/HOLMIUM LASER/STENT PLACEMENT (Bilateral) CYSTOSCOPY, WITH RETROGRADE PYELOGRAM (Bilateral)  Patient Location: PACU  Anesthesia Type:General  Level of Consciousness: awake and alert   Airway & Oxygen Therapy: Patient Spontanous Breathing and Patient connected to face mask oxygen  Post-op Assessment: Report given to RN and Post -op Vital signs reviewed and stable  Post vital signs: Reviewed and stable  Last Vitals:  Vitals Value Taken Time  BP 112/57 10/26/23 12:38  Temp    Pulse 72 10/26/23 12:40  Resp 16 10/26/23 12:40  SpO2 99 % 10/26/23 12:40  Vitals shown include unfiled device data.  Last Pain:  Vitals:   10/26/23 0942  TempSrc:   PainSc: 0-No pain         Complications: No notable events documented.

## 2023-10-26 NOTE — Anesthesia Preprocedure Evaluation (Addendum)
 Anesthesia Evaluation  Patient identified by MRN, date of birth, ID band Patient awake    Reviewed: Allergy & Precautions, NPO status , Patient's Chart, lab work & pertinent test results  History of Anesthesia Complications (+) PONV and history of anesthetic complications  Airway Mallampati: II  TM Distance: >3 FB Neck ROM: Full    Dental  (+) Dental Advisory Given, Teeth Intact   Pulmonary pneumonia   Pulmonary exam normal breath sounds clear to auscultation       Cardiovascular negative cardio ROS Normal cardiovascular exam Rhythm:Regular Rate:Normal     Neuro/Psych negative neurological ROS  negative psych ROS   GI/Hepatic Neg liver ROS,GERD  ,,  Endo/Other  negative endocrine ROS    Renal/GU Renal disease     Musculoskeletal negative musculoskeletal ROS (+)    Abdominal   Peds  Hematology negative hematology ROS (+)   Anesthesia Other Findings  BILATERAL RENAL STONES  Reproductive/Obstetrics                             Anesthesia Physical Anesthesia Plan  ASA: 2  Anesthesia Plan: General   Post-op Pain Management: Tylenol  PO (pre-op)*   Induction: Intravenous  PONV Risk Score and Plan: 4 or greater and Ondansetron , Dexamethasone , Treatment may vary due to age or medical condition and Midazolam   Airway Management Planned: LMA  Additional Equipment:   Intra-op Plan:   Post-operative Plan: Extubation in OR  Informed Consent: I have reviewed the patients History and Physical, chart, labs and discussed the procedure including the risks, benefits and alternatives for the proposed anesthesia with the patient or authorized representative who has indicated his/her understanding and acceptance.     Dental advisory given  Plan Discussed with: CRNA  Anesthesia Plan Comments:         Anesthesia Quick Evaluation

## 2023-10-26 NOTE — Anesthesia Procedure Notes (Signed)
 Procedure Name: LMA Insertion Date/Time: 10/26/2023 11:34 AM  Performed by: Koehn Salehi D, CRNAPre-anesthesia Checklist: Patient identified, Emergency Drugs available, Suction available and Patient being monitored Patient Re-evaluated:Patient Re-evaluated prior to induction Oxygen Delivery Method: Circle system utilized Preoxygenation: Pre-oxygenation with 100% oxygen Induction Type: IV induction Ventilation: Mask ventilation without difficulty LMA: LMA inserted LMA Size: 4.0 Tube type: Oral Number of attempts: 1 Placement Confirmation: positive ETCO2 and breath sounds checked- equal and bilateral Tube secured with: Tape Dental Injury: Teeth and Oropharynx as per pre-operative assessment

## 2023-10-26 NOTE — Anesthesia Postprocedure Evaluation (Signed)
 Anesthesia Post Note  Patient: Megan Dalton  Procedure(s) Performed: CYSTOSCOPY/URETEROSCOPY/HOLMIUM LASER/STENT PLACEMENT (Bilateral) CYSTOSCOPY, WITH RETROGRADE PYELOGRAM (Bilateral)     Patient location during evaluation: PACU Anesthesia Type: General Level of consciousness: sedated and patient cooperative Pain management: pain level controlled Vital Signs Assessment: post-procedure vital signs reviewed and stable Respiratory status: spontaneous breathing Cardiovascular status: stable Anesthetic complications: no   No notable events documented.  Last Vitals:  Vitals:   10/26/23 1415 10/26/23 1430  BP: (!) 102/56 (!) 103/55  Pulse: 62 65  Resp: 17 17  Temp:    SpO2: 96% 99%    Last Pain:  Vitals:   10/26/23 1430  TempSrc:   PainSc: 4                  Jamis Kryder [employer]

## 2023-10-27 ENCOUNTER — Encounter (HOSPITAL_COMMUNITY): Payer: Self-pay | Admitting: Urology

## 2023-10-27 NOTE — Op Note (Signed)
 NAMERASHAD, OBEID MEDICAL RECORD NO: [id number] ACCOUNT NO: [id number] DATE OF BIRTH: [birth year] FACILITY: [facility] LOCATION: [facility] PHYSICIAN: Ricardo Likens, MD  Operative Report   DATE OF PROCEDURE: 10/26/2023  PREOPERATIVE DIAGNOSES: 1. Left ureteral and bilateral renal stones. 2. History of urosepsis.  PROCEDURE PERFORMED: 1.  Cystoscopy with bilateral retrograde pyelograms, interpretation. 2.  Bilateral ureteroscopy with laser lithotripsy. 3.  Insertion of bilateral ureteral stents.  ESTIMATED BLOOD LOSS:  Nil.  COMPLICATIONS:  None.  SPECIMENS: Left distal ureteral stone given to the patient.  FINDINGS: 1. Small fusiform left distal ureteral stone. 2. Small volume bilateral papillary tip calcifications. 3. Complete resolution of all accessible stone fragments larger than one-third mm bilaterally following laser lithotripsy and basket extraction. 4.  Successful placement of bilateral ureteral stents, proximal end in renal pelvis, distal end in urinary bladder with tether.  INDICATIONS:  The patient is a pleasant 58 year old lady with a history of recurrent urolithiasis for years. She has been invariably compliant with medical therapy for this. She was found recently on workup of flank pain and presepsis to have a left  distal ureteral stone while she was in [place] . She underwent stenting at that time as a temporizing measure, wished for us  to manage her definitively as she is known to us . Outside imaging did reveal bilateral stone disease. It sounds like relatively  small volume. Options were discussed including unilateral versus bilateral ureteroscopy and she wished to proceed with bilateral with the goal stone free and resume preventive medical therapy. Informed consent was obtained and placed in the medical  record.  DESCRIPTION OF PROCEDURE:  The patient being herself verified, procedure being bilateral ureteroscopic stone manipulation was confirmed. Procedure timeout was  performed and intravenous antibiotics administered. General LMA anesthesia induced. The patient  was placed into a low lithotomy position. Sterile field was created, prepped and draped the patient's vagina, introitus and proximal thighs using iodine. Cystourethroscopy was performed using a 21-French rigid cystoscope with offset lens. Distal end of  the left ureteral stent was seen in situ. It was grasped out in its entirety, set aside for discard, inspected and intact. The left ureteral orifice was cannulated with a 6-French end-hole catheter and left retrograde pyelogram was obtained. .  Left retrograde pyelogram demonstrated a single left ureter, single system left kidney.  No filling defects were noted.  A 0.038 ZIPwire was advanced to lower pole and set aside as a safety wire. Next, right retrograde pyelogram was obtained.  Right retrograde pyelogram demonstrated a single right ureter, single system right kidney. No filling defects or narrowing noted. A 0.038 ZIPwire was advanced to lower pole and set aside as a safety wire.  An 8-French feeding tube placed in the urinary  bladder for pressure release. Next, a semirigid ureteroscopy performed of the distal four-fifths of the right ureter alongside a separate sensor working wire.  No mucosal abnormalities were found.  Next, semirigid ureteroscopy was performed of the distal  left ureter alongside a separate sensor working wire. In the distal ureter, there is fusiform stone consistent with prior. It was amenable for simple basketing and the Escape basket was removed, and set aside. Otherwise, the distal four-fifths of the  left ureter was unremarkable. Next, semirigid scope was then exchanged for a short-length ureteral access sheath over the sensor working wire to the level of the proximal ureter, and flexible digital ureteroscopy was performed of the proximal left ureter  and systematic inspection of the left kidney including all calices x3. There was  multifocal very small volume papillary tip calcifications that were amenable to laser ablation using a setting of 0.2 joules and 20 Hz. Following this, multiple size stones  were completely ablated and fragments larger than one-third mm had been removed. The access sheath was removed under continuous vision. No significant mucosal abnormalities were found. Next, the access sheath was placed over the right sensor wire at the  level of the proximal right ureter using continuous fluoroscopic guidance and flexible digital ureteroscopy performed of the proximal right ureter and systematic inspection of the right kidney. Similarly, there were small volume papillary tip  calcifications mostly in the upper mid calyx. These were amenable to simple laser ablation using 0.2 joules and 20 Hz. Following this, there was complete resolution of all accessible stone fragments larger than one-third mm. Excellent hemostasis. Access  sheath was removed under continuing vision. No significant mucosal abnormalities were found. Given the bilateral nature of the procedure, it was felt that brief interval stenting with tethered stents would be most prudent and bilateral 5 x 22 cm  Polaris-type stent was carefully placed using fluoroscopic guidance over the remaining safety wires. Good proximal and distal plane were noted.  Tether was left in place, fashioned together, trimmed to length, tucked per vagina, and the procedure was  terminated. The patient tolerated the procedure well. No immediate periprocedural complications. The patient was taken in postanesthesia care unit in stable condition.       PAA D: 10/26/2023 12:18:26 pm T: 10/27/2023 3:13:00 am  JOB: [id number]/ [id number]
